# Patient Record
Sex: Male | Born: 1960 | Race: Asian | Hispanic: No | Marital: Married | Smoking: Never smoker
Health system: Southern US, Community
[De-identification: ages and names within clinical notes are randomized; demographics above are authoritative.]

## PROBLEM LIST (undated history)

## (undated) DIAGNOSIS — E785 Hyperlipidemia, unspecified: Secondary | ICD-10-CM

## (undated) DIAGNOSIS — I509 Heart failure, unspecified: Secondary | ICD-10-CM

## (undated) DIAGNOSIS — I10 Essential (primary) hypertension: Secondary | ICD-10-CM

## (undated) DIAGNOSIS — I480 Paroxysmal atrial fibrillation: Secondary | ICD-10-CM

## (undated) DIAGNOSIS — I2510 Atherosclerotic heart disease of native coronary artery without angina pectoris: Secondary | ICD-10-CM

## (undated) DIAGNOSIS — I255 Ischemic cardiomyopathy: Secondary | ICD-10-CM

## (undated) DIAGNOSIS — I442 Atrioventricular block, complete: Secondary | ICD-10-CM

## (undated) HISTORY — DX: Essential (primary) hypertension: I10

## (undated) HISTORY — DX: Atherosclerotic heart disease of native coronary artery without angina pectoris: I25.10

## (undated) HISTORY — DX: Paroxysmal atrial fibrillation: I48.0

## (undated) HISTORY — DX: Heart failure, unspecified: I50.9

## (undated) HISTORY — DX: Hyperlipidemia, unspecified: E78.5

## (undated) HISTORY — DX: Atrioventricular block, complete: I44.2

## (undated) HISTORY — DX: Ischemic cardiomyopathy: I25.5

---

## 2019-04-21 ENCOUNTER — Emergency Department: Payer: No Typology Code available for payment source | Admitting: Anesthesiology

## 2019-04-21 ENCOUNTER — Encounter: Payer: Self-pay | Admitting: Emergency Medicine

## 2019-04-21 ENCOUNTER — Inpatient Hospital Stay
Admission: EM | Admit: 2019-04-21 | Discharge: 2019-04-27 | DRG: 328 | Disposition: A | Discharge status: Home / Self Care | Payer: No Typology Code available for payment source | Attending: Surgery | Admitting: Surgery

## 2019-04-21 ENCOUNTER — Encounter
Admission: EM | Disposition: A | Discharge status: Home / Self Care | Payer: Self-pay | Source: Home / Self Care | Attending: Surgery

## 2019-04-21 ENCOUNTER — Emergency Department: Payer: No Typology Code available for payment source

## 2019-04-21 ENCOUNTER — Other Ambulatory Visit: Payer: Self-pay

## 2019-04-21 DIAGNOSIS — K668 Other specified disorders of peritoneum: Secondary | ICD-10-CM | POA: Diagnosis present

## 2019-04-21 DIAGNOSIS — Z23 Encounter for immunization: Secondary | ICD-10-CM

## 2019-04-21 DIAGNOSIS — I440 Atrioventricular block, first degree: Secondary | ICD-10-CM | POA: Diagnosis present

## 2019-04-21 DIAGNOSIS — K255 Chronic or unspecified gastric ulcer with perforation: Principal | ICD-10-CM | POA: Diagnosis present

## 2019-04-21 DIAGNOSIS — Z20828 Contact with and (suspected) exposure to other viral communicable diseases: Secondary | ICD-10-CM | POA: Diagnosis present

## 2019-04-21 HISTORY — PX: LAPAROTOMY: SHX154

## 2019-04-21 LAB — CBC
HCT: 48.2 % (ref 39.0–52.0)
Hemoglobin: 15.6 g/dL (ref 13.0–17.0)
MCH: 31.8 pg (ref 26.0–34.0)
MCHC: 32.4 g/dL (ref 30.0–36.0)
MCV: 98.2 fL (ref 80.0–100.0)
Platelets: 272 10*3/uL (ref 150–400)
RBC: 4.91 MIL/uL (ref 4.22–5.81)
RDW: 11.3 % — ABNORMAL LOW (ref 11.5–15.5)
WBC: 12.5 10*3/uL — ABNORMAL HIGH (ref 4.0–10.5)
nRBC: 0 % (ref 0.0–0.2)

## 2019-04-21 LAB — COMPREHENSIVE METABOLIC PANEL
ALT: 35 U/L (ref 0–44)
AST: 30 U/L (ref 15–41)
Albumin: 4.6 g/dL (ref 3.5–5.0)
Alkaline Phosphatase: 56 U/L (ref 38–126)
Anion gap: 12 (ref 5–15)
BUN: 19 mg/dL (ref 6–20)
CO2: 23 mmol/L (ref 22–32)
Calcium: 9.6 mg/dL (ref 8.9–10.3)
Chloride: 100 mmol/L (ref 98–111)
Creatinine, Ser: 0.79 mg/dL (ref 0.61–1.24)
GFR calc Af Amer: >60 mL/min (ref >60)
GFR calc non Af Amer: >60 mL/min (ref >60)
Glucose, Bld: 113 mg/dL — ABNORMAL HIGH (ref 70–99)
Potassium: 3.6 mmol/L (ref 3.5–5.1)
Sodium: 135 mmol/L (ref 135–145)
Total Bilirubin: 1.1 mg/dL (ref 0.3–1.2)
Total Protein: 8.5 g/dL — ABNORMAL HIGH (ref 6.5–8.1)

## 2019-04-21 LAB — LIPASE, BLOOD: Lipase: 30 U/L (ref 11–51)

## 2019-04-21 LAB — SARS CORONAVIRUS 2 BY RT PCR (HOSPITAL ORDER, PERFORMED IN ~~LOC~~ HOSPITAL LAB): SARS Coronavirus 2: NEGATIVE

## 2019-04-21 LAB — TROPONIN I (HIGH SENSITIVITY): Troponin I (High Sensitivity): 4 ng/L (ref <18)

## 2019-04-21 LAB — LACTIC ACID, PLASMA: Lactic Acid, Venous: 1.6 mmol/L (ref 0.5–1.9)

## 2019-04-21 SURGERY — LAPAROTOMY, EXPLORATORY
Anesthesia: General | Site: Abdomen

## 2019-04-21 MED ORDER — ONDANSETRON HCL 4 MG/2ML IJ SOLN
4.0000 mg | Freq: Once | INTRAMUSCULAR | Status: AC | PRN
  Administered 2019-04-21: 20:00:00 4 mg via INTRAVENOUS

## 2019-04-21 MED ORDER — ACETAMINOPHEN 10 MG/ML IV SOLN
INTRAVENOUS | Status: AC
  Filled 2019-04-21: qty 100

## 2019-04-21 MED ORDER — LACTATED RINGERS IV SOLN
INTRAVENOUS | Status: DC | PRN
  Administered 2019-04-21: 22:00:00 via INTRAVENOUS

## 2019-04-21 MED ORDER — SUGAMMADEX SODIUM 500 MG/5ML IV SOLN
INTRAVENOUS | Status: DC | PRN
  Administered 2019-04-21: 100 mg via INTRAVENOUS

## 2019-04-21 MED ORDER — PROPOFOL 10 MG/ML IV BOLUS
INTRAVENOUS | Status: DC | PRN
  Administered 2019-04-21: 100 mg via INTRAVENOUS

## 2019-04-21 MED ORDER — FENTANYL CITRATE (PF) 100 MCG/2ML IJ SOLN
INTRAMUSCULAR | Status: AC
  Filled 2019-04-21: qty 2

## 2019-04-21 MED ORDER — EPINEPHRINE PF 1 MG/ML IJ SOLN
INTRAMUSCULAR | Status: AC
  Filled 2019-04-21: qty 1

## 2019-04-21 MED ORDER — BUPIVACAINE-EPINEPHRINE 0.5% -1:200000 IJ SOLN
INTRAMUSCULAR | Status: DC | PRN
  Administered 2019-04-21: 30 mL

## 2019-04-21 MED ORDER — PROPOFOL 10 MG/ML IV BOLUS
INTRAVENOUS | Status: AC
  Filled 2019-04-21: qty 20

## 2019-04-21 MED ORDER — BUPIVACAINE LIPOSOME 1.3 % IJ SUSP
INTRAMUSCULAR | Status: DC | PRN
  Administered 2019-04-21: 20 mL

## 2019-04-21 MED ORDER — ONDANSETRON 4 MG PO TBDP: 4.0000 mg | ORAL_TABLET | Freq: Once | ORAL | Status: DC | PRN

## 2019-04-21 MED ORDER — BUPIVACAINE LIPOSOME 1.3 % IJ SUSP
INTRAMUSCULAR | Status: AC
  Filled 2019-04-21: qty 20

## 2019-04-21 MED ORDER — LIDOCAINE HCL (CARDIAC) PF 100 MG/5ML IV SOSY
PREFILLED_SYRINGE | INTRAVENOUS | Status: DC | PRN
  Administered 2019-04-21: 70 mg via INTRAVENOUS

## 2019-04-21 MED ORDER — SUCCINYLCHOLINE CHLORIDE 20 MG/ML IJ SOLN
INTRAMUSCULAR | Status: DC | PRN
  Administered 2019-04-21: 60 mg via INTRAVENOUS

## 2019-04-21 MED ORDER — FENTANYL CITRATE (PF) 100 MCG/2ML IJ SOLN
50.0000 ug | INTRAMUSCULAR | Status: AC | PRN
  Administered 2019-04-21 (×2): 50 ug via INTRAVENOUS
  Filled 2019-04-21: qty 2

## 2019-04-21 MED ORDER — PIPERACILLIN-TAZOBACTAM 3.375 G IVPB 30 MIN: 3.3750 g | Freq: Four times a day (QID) | INTRAVENOUS | Status: DC

## 2019-04-21 MED ORDER — PIPERACILLIN-TAZOBACTAM 3.375 G IVPB 30 MIN
3.3750 g | Freq: Once | INTRAVENOUS | Status: AC
  Administered 2019-04-21: 21:00:00 3.375 g via INTRAVENOUS
  Filled 2019-04-21: qty 50

## 2019-04-21 MED ORDER — ROCURONIUM BROMIDE 100 MG/10ML IV SOLN
INTRAVENOUS | Status: DC | PRN
  Administered 2019-04-21: 30 mg via INTRAVENOUS

## 2019-04-21 MED ORDER — ACETAMINOPHEN 10 MG/ML IV SOLN
INTRAVENOUS | Status: DC | PRN
  Administered 2019-04-21: 1000 mg via INTRAVENOUS

## 2019-04-21 MED ORDER — PROMETHAZINE HCL 25 MG/ML IJ SOLN: 6.2500 mg | INTRAMUSCULAR | Status: DC | PRN

## 2019-04-21 MED ORDER — ONDANSETRON HCL 4 MG/2ML IJ SOLN
INTRAMUSCULAR | Status: DC | PRN
  Administered 2019-04-21: 4 mg via INTRAVENOUS

## 2019-04-21 MED ORDER — ONDANSETRON HCL 4 MG/2ML IJ SOLN
INTRAMUSCULAR | Status: AC
  Administered 2019-04-21: 4 mg via INTRAVENOUS
  Filled 2019-04-21: qty 2

## 2019-04-21 MED ORDER — SODIUM CHLORIDE (PF) 0.9 % IJ SOLN
INTRAMUSCULAR | Status: AC
  Filled 2019-04-21: qty 50

## 2019-04-21 MED ORDER — PIPERACILLIN-TAZOBACTAM 3.375 G IVPB
3.3750 g | Freq: Three times a day (TID) | INTRAVENOUS | Status: DC
  Administered 2019-04-22 – 2019-04-27 (×16): 3.375 g via INTRAVENOUS
  Filled 2019-04-21 (×16): qty 50

## 2019-04-21 MED ORDER — SODIUM CHLORIDE (PF) 0.9 % IJ SOLN
INTRAMUSCULAR | Status: DC | PRN
  Administered 2019-04-21: 50 mL via INTRAVENOUS

## 2019-04-21 MED ORDER — MORPHINE SULFATE (PF) 4 MG/ML IV SOLN
4.0000 mg | Freq: Once | INTRAVENOUS | Status: AC
  Administered 2019-04-21: 21:00:00 4 mg via INTRAVENOUS
  Filled 2019-04-21: qty 1

## 2019-04-21 MED ORDER — FENTANYL CITRATE (PF) 100 MCG/2ML IJ SOLN
INTRAMUSCULAR | Status: AC
  Administered 2019-04-21: 20:00:00 50 ug via INTRAVENOUS
  Filled 2019-04-21: qty 2

## 2019-04-21 MED ORDER — FENTANYL CITRATE (PF) 100 MCG/2ML IJ SOLN
INTRAMUSCULAR | Status: DC | PRN
  Administered 2019-04-21 (×2): 50 ug via INTRAVENOUS

## 2019-04-21 MED ORDER — FENTANYL CITRATE (PF) 100 MCG/2ML IJ SOLN: 25.0000 ug | INTRAMUSCULAR | Status: DC | PRN

## 2019-04-21 MED ORDER — BUPIVACAINE HCL (PF) 0.5 % IJ SOLN
INTRAMUSCULAR | Status: AC
  Filled 2019-04-21: qty 30

## 2019-04-21 SURGICAL SUPPLY — 42 items
BULB RESERV EVAC DRAIN JP 100C (MISCELLANEOUS) ×3 IMPLANT
CANISTER SUCT 1200ML W/VALVE (MISCELLANEOUS) ×3 IMPLANT
CHLORAPREP W/TINT 26 (MISCELLANEOUS) ×3 IMPLANT
COVER WAND RF STERILE (DRAPES) IMPLANT
DRAIN CHANNEL JP 15F RND 16 (MISCELLANEOUS) ×3 IMPLANT
DRAPE LAPAROTOMY 100X77 ABD (DRAPES) ×3 IMPLANT
DRSG OPSITE POSTOP 4X10 (GAUZE/BANDAGES/DRESSINGS) IMPLANT
DRSG OPSITE POSTOP 4X6 (GAUZE/BANDAGES/DRESSINGS) ×3 IMPLANT
DRSG OPSITE POSTOP 4X8 (GAUZE/BANDAGES/DRESSINGS) IMPLANT
DRSG TEGADERM 4X10 (GAUZE/BANDAGES/DRESSINGS) IMPLANT
DRSG TELFA 3X8 NADH (GAUZE/BANDAGES/DRESSINGS) IMPLANT
ELECT REM PT RETURN 9FT ADLT (ELECTROSURGICAL) ×3
ELECTRODE REM PT RTRN 9FT ADLT (ELECTROSURGICAL) ×1 IMPLANT
GLOVE BIOGEL PI IND STRL 7.0 (GLOVE) ×3 IMPLANT
GLOVE BIOGEL PI INDICATOR 7.0 (GLOVE) ×6
GLOVE SURG SYN 6.5 ES PF (GLOVE) ×9 IMPLANT
GOWN STRL REUS W/ TWL LRG LVL3 (GOWN DISPOSABLE) ×3 IMPLANT
GOWN STRL REUS W/TWL LRG LVL3 (GOWN DISPOSABLE) ×6
KIT TURNOVER KIT A (KITS) ×3 IMPLANT
LABEL OR SOLS (LABEL) IMPLANT
NEEDLE FILTER BLUNT 18X 1/2SAF (NEEDLE) ×2
NEEDLE FILTER BLUNT 18X1 1/2 (NEEDLE) ×1 IMPLANT
NEEDLE HYPO 22GX1.5 SAFETY (NEEDLE) ×3 IMPLANT
NS IRRIG 1000ML POUR BTL (IV SOLUTION) ×3 IMPLANT
PACK BASIN MAJOR ARMC (MISCELLANEOUS) ×3 IMPLANT
PACK COLON CLEAN CLOSURE (MISCELLANEOUS) IMPLANT
SLEEVE SCD COMPRESS THIGH MED (MISCELLANEOUS) ×3 IMPLANT
SPONGE DRAIN TRACH 4X4 STRL 2S (GAUZE/BANDAGES/DRESSINGS) ×3 IMPLANT
SPONGE LAP 18X18 RF (DISPOSABLE) ×3 IMPLANT
STAPLER SKIN PROX 35W (STAPLE) ×3 IMPLANT
SUT ETHILON 3-0 FS-10 30 BLK (SUTURE) ×3
SUT PDS AB 1 TP1 54 (SUTURE) ×6 IMPLANT
SUT SILK 2 0 (SUTURE) ×2
SUT SILK 2-0 18XBRD TIE 12 (SUTURE) ×1 IMPLANT
SUT SILK 3 0 (SUTURE) ×2
SUT SILK 3-0 18XBRD TIE 12 (SUTURE) ×1 IMPLANT
SUT VIC AB 3-0 SH 27 (SUTURE) ×4
SUT VIC AB 3-0 SH 27X BRD (SUTURE) ×2 IMPLANT
SUTURE EHLN 3-0 FS-10 30 BLK (SUTURE) ×1 IMPLANT
SYR 20ML LL LF (SYRINGE) ×6 IMPLANT
SYR TB 1ML 27GX1/2 LL (SYRINGE) ×3 IMPLANT
TRAY FOLEY MTR SLVR 16FR STAT (SET/KITS/TRAYS/PACK) ×3 IMPLANT

## 2019-04-21 NOTE — Anesthesia Preprocedure Evaluation (Signed)
Anesthesia Evaluation  Patient identified by MRN, date of birth, ID band Patient awake    Reviewed: Allergy & Precautions, H&P , NPO status , Patient's Chart, lab work & pertinent test results, reviewed documented beta blocker date and time   History of Anesthesia Complications Negative for: history of anesthetic complications  Airway Mallampati: I  TM Distance: >3 FB Neck ROM: full    Dental  (+) Partial Upper, Partial Lower, Missing, Dental Advidsory Given   Pulmonary shortness of breath (Currently, secondary to pain), neg recent URI,    Pulmonary exam normal        Cardiovascular Exercise Tolerance: Good (-) hypertension+ angina (Currently, but negative EKG and trop) (-) Past MI Normal cardiovascular exam(-) dysrhythmias (-) Valvular Problems/Murmurs     Neuro/Psych negative neurological ROS  negative psych ROS   GI/Hepatic Neg liver ROS, PUD (suspected perfed ulcer),   Endo/Other  negative endocrine ROS  Renal/GU negative Renal ROS  negative genitourinary   Musculoskeletal   Abdominal   Peds  Hematology negative hematology ROS (+)   Anesthesia Other Findings History reviewed. No pertinent past medical history.   Reproductive/Obstetrics negative OB ROS                             Anesthesia Physical Anesthesia Plan  ASA: II and emergent  Anesthesia Plan: General   Post-op Pain Management:    Induction: Intravenous, Rapid sequence and Cricoid pressure planned  PONV Risk Score and Plan: 2 and Ondansetron, Dexamethasone, Midazolam, Promethazine and Treatment may vary due to age or medical condition  Airway Management Planned: Oral ETT  Additional Equipment:   Intra-op Plan:   Post-operative Plan: Extubation in OR  Informed Consent: I have reviewed the patients History and Physical, chart, labs and discussed the procedure including the risks, benefits and alternatives for the  proposed anesthesia with the patient or authorized representative who has indicated his/her understanding and acceptance.     Dental Advisory Given  Plan Discussed with: Anesthesiologist, CRNA and Surgeon  Anesthesia Plan Comments:         Anesthesia Quick Evaluation

## 2019-04-21 NOTE — ED Notes (Signed)
Archie Balboa MD updated on pt status. Orders obtained.

## 2019-04-21 NOTE — ED Triage Notes (Signed)
Patient to ER for c/o epigastric pain that began suddenly approx 45 mins ago. Reports feeling like he had an ulcer last week. States he collapsed at work, did not have syncopal episode.

## 2019-04-21 NOTE — Transfer of Care (Signed)
Immediate Anesthesia Transfer of Care Note  Patient: Craig Reid  Procedure(s) Performed: EXPLORATORY LAPAROTOMY, gastric ulcer repair (N/A Abdomen)  Patient Location: PACU  Anesthesia Type:General  Level of Consciousness: sedated  Airway & Oxygen Therapy: Patient Spontanous Breathing and Patient connected to face mask oxygen  Post-op Assessment: Report given to RN and Post -op Vital signs reviewed and stable  Post vital signs: Reviewed and stable  Last Vitals:  Vitals Value Taken Time  BP 157/88 04/21/19 2352  Temp    Pulse 87 04/21/19 2353  Resp 18 04/21/19 2353  SpO2 100 % 04/21/19 2353  Vitals shown include unvalidated device data.  Last Pain:  Vitals:   04/21/19 2009  TempSrc: Oral  PainSc: 10-Worst pain ever         Complications: No apparent anesthesia complications

## 2019-04-21 NOTE — ED Notes (Signed)
Barbee Cough (co-worker) [phone] Zigmund Daniel (niece) [phone]

## 2019-04-21 NOTE — H&P (Signed)
Subjective:   CC: pneumoperitoneum  HPI:  Craig Reid is a 58 y.o. male who was consulted by Derrill Kay for issue above.  Symptoms were first noted a few hours ago. Pain is sudden, severe located in epigastric pain.  Associated with nothing specific, exacerbated by nothing specfic.     Past Medical History: none reported  Past Surgical History: none reported  Family History: not related to current issue  Social History:  reports that he has never smoked. He has never used smokeless tobacco. He reports that he does not drink alcohol. No history on file for drug.  Current Medications: MVI  Allergies:  Allergies as of 04/21/2019  . (No Known Allergies)    ROS:  General: Denies weight loss, weight gain, fatigue, fevers, chills, and night sweats. Eyes: Denies blurry vision, double vision, eye pain, itchy eyes, and tearing. Ears: Denies hearing loss, earache, and ringing in ears. Nose: Denies sinus pain, congestion, infections, runny nose, and nosebleeds. Mouth/throat: Denies hoarseness, sore throat, bleeding gums, and difficulty swallowing. Heart: Denies chest pain, palpitations, racing heart, irregular heartbeat, leg pain or swelling, and decreased activity tolerance. Respiratory: Denies breathing difficulty, shortness of breath, wheezing, cough, and sputum. GI: Denies change in appetite, heartburn, nausea, vomiting, constipation, diarrhea, and blood in stool. GU: Denies difficulty urinating, pain with urinating, urgency, frequency, blood in urine. Musculoskeletal: Denies joint stiffness, pain, swelling, muscle weakness. Skin: Denies rash, itching, mass, tumors, sores, and boils Neurologic: Denies headache, fainting, dizziness, seizures, numbness, and tingling. Psychiatric: Denies depression, anxiety, difficulty sleeping, and memory loss. Endocrine: Denies heat or cold intolerance, and increased thirst or urination. Blood/lymph: Denies easy bruising, easy bruising, and swollen  glands     Objective:     BP (!) 142/92   Pulse 74   Resp 20   Ht 5\' 5"  (1.651 m)   Wt 49.9 kg   SpO2 100%   BMI 18.30 kg/m   Constitutional :  alert, cooperative, appears stated age and no distress  Lymphatics/Throat:  no asymmetry, masses, or scars  Respiratory:  clear to auscultation bilaterally  Cardiovascular:  regular rate and rhythm  Gastrointestinal: guarding, and TTP all quadrants.   Musculoskeletal: Steady movement  Skin: Cool and moist   Psychiatric: Normal affect, non-agitated, not confused       LABS:  CMP Latest Ref Rng & Units 04/21/2019  Glucose 70 - 99 mg/dL 04/23/2019)  BUN 6 - 20 mg/dL 19  Creatinine 272(Z - 3.66 mg/dL 4.40  Sodium 3.47 - 425 mmol/L 135  Potassium 3.5 - 5.1 mmol/L 3.6  Chloride 98 - 111 mmol/L 100  CO2 22 - 32 mmol/L 23  Calcium 8.9 - 10.3 mg/dL 9.6  Total Protein 6.5 - 8.1 g/dL 956)  Total Bilirubin 0.3 - 1.2 mg/dL 1.1  Alkaline Phos 38 - 126 U/L 56  AST 15 - 41 U/L 30  ALT 0 - 44 U/L 35   CBC Latest Ref Rng & Units 04/21/2019  WBC 4.0 - 10.5 K/uL 12.5(H)  Hemoglobin 13.0 - 17.0 g/dL 04/23/2019  Hematocrit 56.4 - 52.0 % 48.2  Platelets 150 - 400 K/uL 272    RADS: CLINICAL DATA:  Epigastric pain, shortness of breath  EXAM: CHEST - 2 VIEW  COMPARISON:  None.  FINDINGS: There is free intraperitoneal air noted under both hemidiaphragms. Heart and mediastinal contours are within normal limits. No focal opacities or effusions. No acute bony abnormality.  IMPRESSION: No active cardiopulmonary disease.  Pneumoperitoneum concerning for perforated bowel.  Critical Value/emergent results were  called by telephone at the time of interpretation on 04/21/2019 at 8:27 pm to Graham , who verbally acknowledged these results.   Electronically Signed   By: Rolm Baptise M.D.   On: 04/21/2019 20:28 Assessment:   Pneumoperitoneum, likely perforated gastric ulcer based on epigastric pain  Plan:      The  risk of surgery include, but not limited to, recurrence, bleeding, chronic pain, post-op infxn, post-op SBO or ileus, hernias, resection of bowel, re-anastamosis, and need for re-operation to address said risks. The risks of general anesthetic, if used, includes MI, CVA, sudden death or even reaction to anesthetic medications also discussed. Alternatives include none. Benefits include possible symptom relief, preventing further decline in health and possible death.  Typical post-op recovery time of additional days in hospital for observation afterwards also discussed.  The patient verbalized understanding and all questions were answered to the patient's satisfaction.  Encounter done via nice over phone who is in healthcare field due to lack of available translator.  To OR for ex-lap, pending covid but if not ready by the time room is setup, will still proceed due to emergent nature of issue.

## 2019-04-21 NOTE — Anesthesia Procedure Notes (Signed)
Procedure Name: Intubation Date/Time: 04/21/2019 10:27 PM Performed by: Lendon Colonel, CRNA Pre-anesthesia Checklist: Patient identified, Patient being monitored, Timeout performed, Emergency Drugs available and Suction available Patient Re-evaluated:Patient Re-evaluated prior to induction Oxygen Delivery Method: Circle system utilized Preoxygenation: Pre-oxygenation with 100% oxygen Induction Type: IV induction, Rapid sequence and Cricoid Pressure applied Laryngoscope Size: 3 and McGraph Grade View: Grade I Tube type: Oral Tube size: 7.5 mm Number of attempts: 1 Airway Equipment and Method: Stylet Placement Confirmation: ETT inserted through vocal cords under direct vision,  positive ETCO2 and breath sounds checked- equal and bilateral Secured at: 21 cm Tube secured with: Tape Dental Injury: Teeth and Oropharynx as per pre-operative assessment

## 2019-04-21 NOTE — Anesthesia Post-op Follow-up Note (Signed)
Anesthesia QCDR form completed.        

## 2019-04-21 NOTE — ED Provider Notes (Signed)
[facility] Emergency Department Provider Note   ____________________________________________   I have reviewed the triage vital signs and the nursing notes.   HISTORY  Chief Complaint Abdominal Pain   History limited by: Language barrier.    HPI Craig Reid is a 58 y.o. male who presents to the emergency department today because of concerns for abdominal pain.  Patient speaks very minimal [place].  Translation was done by patient's coworker.  Apparently roughly 45 minutes prior to arrival the patient had sudden onset of acute abdominal pain.  Located in the center abdomen.  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  Patient apparently has a history of gastric ulcers.  Patient states that he is otherwise been feeling okay recently.  No previous medical record in our EMR.   History reviewed. No pertinent past medical history.  There are no active problems to display for this patient.   History reviewed. No pertinent surgical history.  Prior to Admission medications   Medication Sig Start Date End Date Taking? Authorizing Provider  Multiple Vitamin (MULTIVITAMIN WITH MINERALS) TABS tablet Take 1 tablet by mouth daily.   Yes [provider]    Allergies Patient has no known allergies.  No family history on file.  Social History Social History   Tobacco Use  . Smoking status: Never Smoker  . Smokeless tobacco: Never Used  Substance Use Topics  . Alcohol use: Never    Frequency: Never  . Drug use: Not on file    Review of Systems Constitutional: No fever/chills Eyes: No visual changes. ENT: No sore throat. Cardiovascular: Denies chest pain. Respiratory: Denies shortness of breath. Gastrointestinal: Positive for abdominal pain.  Genitourinary: Negative for dysuria. Musculoskeletal: Negative for back pain. Skin: Negative for rash. Neurological: Negative for headaches, focal  weakness or numbness.  ____________________________________________   PHYSICAL EXAM:  VITAL SIGNS: ED Triage Vitals  Enc Vitals Group     BP 04/21/19 2009 (!) 179/97     Pulse Rate 04/21/19 2009 90     Resp 04/21/19 2009 20     Temp --      Temp Source 04/21/19 2009 Oral     SpO2 04/21/19 2009 100 %     Weight 04/21/19 2010 110 lb (49.9 kg)     Height 04/21/19 2010 5\' 5"  (1.651 m)     Head Circumference --      Peak Flow --      Pain Score 04/21/19 2009 10   Constitutional: Alert and oriented. Appears uncomfortable.  Eyes: Conjunctivae are normal.  ENT      Head: Normocephalic and atraumatic.      Nose: No congestion/rhinnorhea.      Mouth/Throat: Mucous membranes are moist.      Neck: No stridor. Hematological/Lymphatic/Immunilogical: No cervical lymphadenopathy. Cardiovascular: Normal rate, regular rhythm.  No murmurs, rubs, or gallops. Respiratory: Normal respiratory effort without tachypnea nor retractions. Breath sounds are clear and equal bilaterally. No wheezes/rales/rhonchi. Gastrointestinal: Tense. Tender to palpation.  Genitourinary: Deferred Musculoskeletal: Normal range of motion in all extremities. No lower extremity edema. Neurologic:  Normal speech and language. No gross focal neurologic deficits are appreciated.  Skin:  Skin is warm, dry and intact. No rash noted. Psychiatric: Mood and affect are normal. Speech and behavior are normal. Patient exhibits appropriate insight and judgment.  ____________________________________________    LABS (pertinent positives/negatives)  Trop hs 4 Lipase 30 CBC wbc 12.5, hgb 15.6, plt  272 CMP wnl except glu 113, t pro 8.5 Lactic 1.6 COVID neg  ____________________________________________   EKG  I, Nance Pear, attending physician, personally viewed and interpreted this EKG  EKG Time: 2010 Rate: 83 Rhythm: sinus rhythm with 1st degree av block Axis: left axis deviation Intervals: qtc 427 QRS: narrow, q  waves v1, v2 ST changes: no st elevation Impression: abnormal ekg   ____________________________________________    RADIOLOGY  CXR Pneumoperitoneum.   ____________________________________________   PROCEDURES  Procedures  CRITICAL CARE Performed by: Nance Pear   Total critical care time: 30 minutes  Critical care time was exclusive of separately billable procedures and treating other patients.  Critical care was necessary to treat or prevent imminent or life-threatening deterioration.  Critical care was time spent personally by me on the following activities: development of treatment plan with patient and/or surrogate as well as nursing, discussions with consultants, evaluation of patient's response to treatment, examination of patient, obtaining history from patient or surrogate, ordering and performing treatments and interventions, ordering and review of laboratory studies, ordering and review of radiographic studies, pulse oximetry and re-evaluation of patient's condition.  ____________________________________________   INITIAL IMPRESSION / ASSESSMENT AND PLAN / ED COURSE  Pertinent labs & imaging results that were available during my care of the patient were reviewed by me and considered in my medical decision making (see chart for details).   Patient presented to the emergency department today because of concerns for sudden onset of abdominal pain.  The patient did have a concerning abdominal exam.  It was a tense abdomen with tenderness.  Chest x-ray ordered from triage does show pneumoperitoneum.  Given history of gastric ulcers to have concerns for perforated ulcer.  Did discuss with surgery.  They will take patient to the OR for exploratory laparotomy.  Did give patient dose of antibiotics here in the emergency department.  Discussed findings with patient.  Also discussed over the telephone with niece.  ____________________________________________   FINAL  CLINICAL IMPRESSION(S) / ED DIAGNOSES  Final diagnoses:  Pneumoperitoneum     Note: This dictation was prepared with Dragon dictation. Any transcriptional errors that result from this process are unintentional     Nance Pear, MD 04/21/19 2218

## 2019-04-22 ENCOUNTER — Other Ambulatory Visit: Payer: Self-pay

## 2019-04-22 ENCOUNTER — Encounter: Payer: Self-pay | Admitting: Surgery

## 2019-04-22 LAB — BASIC METABOLIC PANEL
Anion gap: 14 (ref 5–15)
BUN: 14 mg/dL (ref 6–20)
CO2: 27 mmol/L (ref 22–32)
Calcium: 8.9 mg/dL (ref 8.9–10.3)
Chloride: 96 mmol/L — ABNORMAL LOW (ref 98–111)
Creatinine, Ser: 0.92 mg/dL (ref 0.61–1.24)
GFR calc Af Amer: >60 mL/min (ref >60)
GFR calc non Af Amer: >60 mL/min (ref >60)
Glucose, Bld: 114 mg/dL — ABNORMAL HIGH (ref 70–99)
Potassium: 4 mmol/L (ref 3.5–5.1)
Sodium: 137 mmol/L (ref 135–145)

## 2019-04-22 LAB — CBC
HCT: 42.2 % (ref 39.0–52.0)
Hemoglobin: 13.7 g/dL (ref 13.0–17.0)
MCH: 31.4 pg (ref 26.0–34.0)
MCHC: 32.5 g/dL (ref 30.0–36.0)
MCV: 96.8 fL (ref 80.0–100.0)
Platelets: 230 10*3/uL (ref 150–400)
RBC: 4.36 MIL/uL (ref 4.22–5.81)
RDW: 11.5 % (ref 11.5–15.5)
WBC: 18 10*3/uL — ABNORMAL HIGH (ref 4.0–10.5)
nRBC: 0 % (ref 0.0–0.2)

## 2019-04-22 MED ORDER — KETOROLAC TROMETHAMINE 30 MG/ML IJ SOLN
30.0000 mg | Freq: Four times a day (QID) | INTRAMUSCULAR | Status: AC | PRN
  Administered 2019-04-22 – 2019-04-25 (×5): 30 mg via INTRAVENOUS
  Filled 2019-04-22 (×6): qty 1

## 2019-04-22 MED ORDER — ENOXAPARIN SODIUM 40 MG/0.4ML ~~LOC~~ SOLN
40.0000 mg | SUBCUTANEOUS | Status: DC
  Administered 2019-04-22 – 2019-04-26 (×5): 40 mg via SUBCUTANEOUS
  Filled 2019-04-22 (×5): qty 0.4

## 2019-04-22 MED ORDER — MORPHINE SULFATE (PF) 2 MG/ML IV SOLN
1.0000 mg | INTRAVENOUS | Status: DC | PRN
  Administered 2019-04-23 – 2019-04-24 (×2): 1 mg via INTRAVENOUS
  Filled 2019-04-22 (×2): qty 1

## 2019-04-22 MED ORDER — INFLUENZA VAC SPLIT QUAD 0.5 ML IM SUSY
0.5000 mL | PREFILLED_SYRINGE | INTRAMUSCULAR | Status: AC
  Administered 2019-04-25: 0.5 mL via INTRAMUSCULAR
  Filled 2019-04-22: qty 0.5

## 2019-04-22 MED ORDER — PANTOPRAZOLE SODIUM 40 MG IV SOLR
40.0000 mg | Freq: Every day | INTRAVENOUS | Status: DC
  Administered 2019-04-22 – 2019-04-26 (×6): 40 mg via INTRAVENOUS
  Filled 2019-04-22 (×6): qty 40

## 2019-04-22 MED ORDER — PHENOL 1.4 % MT LIQD
1.0000 | OROMUCOSAL | Status: DC | PRN
  Filled 2019-04-22: qty 177

## 2019-04-22 MED ORDER — ONDANSETRON HCL 4 MG/2ML IJ SOLN
4.0000 mg | Freq: Four times a day (QID) | INTRAMUSCULAR | Status: DC | PRN
  Administered 2019-04-25: 06:00:00 4 mg via INTRAVENOUS
  Filled 2019-04-22: qty 2

## 2019-04-22 MED ORDER — SODIUM CHLORIDE 0.9 % IV SOLN
INTRAVENOUS | Status: DC | PRN
  Administered 2019-04-22 – 2019-04-25 (×2): 250 mL via INTRAVENOUS

## 2019-04-22 MED ORDER — LACTATED RINGERS IV SOLN
INTRAVENOUS | Status: DC
  Administered 2019-04-22 – 2019-04-23 (×3): via INTRAVENOUS

## 2019-04-22 MED ORDER — ONDANSETRON 4 MG PO TBDP: 4.0000 mg | ORAL_TABLET | Freq: Four times a day (QID) | ORAL | Status: DC | PRN

## 2019-04-22 NOTE — Progress Notes (Signed)
Pharmacy Antibiotic Note  Craig Reid is a 58 y.o. male admitted on 04/21/2019 with pneumoperitoneum s/t perforated bowel.  Pharmacy has been consulted for zosyn dosing.  Plan: Zosyn 3.375g IV q8h (4 hour infusion).  Height: 5\' 5"  (165.1 cm) Weight: 110 lb (49.9 kg) IBW/kg (Calculated) : 61.5  Temp (24hrs), Avg:97.5 F (36.4 C), Min:97 F (36.1 C), Max:98 F (36.7 C)  Recent Labs  Lab 04/21/19 2015 04/21/19 2016  WBC  --  12.5*  CREATININE  --  0.79  LATICACIDVEN 1.6  --     Estimated Creatinine Clearance: 72.8 mL/min (by C-G formula based on SCr of 0.79 mg/dL).    No Known Allergies  Thank you for allowing pharmacy to be a part of this patient's care.  Tobie Lords, PharmD, BCPS Clinical Pharmacist 04/22/2019 1:03 AM

## 2019-04-22 NOTE — Progress Notes (Signed)
Subjective:  CC: Craig Reid is a 58 y.o. male  Hospital stay day 1, 1 Day Post-Op open gastric ulcer perforation repair and Phillip Heal patch  HPI: Reporting of heart rate dropping into the low 40s with "irregularities", asymptomatic throughout episode.  No issues at time of exam.   Objective:   Temp:  [97 F (36.1 C)-98.2 F (36.8 C)] 97.7 F (36.5 C) (11/18 0508) Pulse Rate:  [47-99] 47 (11/18 0623) Resp:  [16-37] 16 (11/18 0623) BP: (112-179)/(66-97) 112/66 (11/18 0508) SpO2:  [96 %-100 %] 98 % (11/18 0623) Weight:  [49.9 kg] 49.9 kg (11/17 2010)     Height: 5\' 5"  (165.1 cm) Weight: 49.9 kg BMI (Calculated): 18.31   Intake/Output this shift:   Intake/Output Summary (Last 24 hours) at 04/22/2019 1017 Last data filed at 04/22/2019 0547 Gross per 24 hour  Intake 1144.78 ml  Output 1080 ml  Net 64.78 ml    Constitutional :  alert, cooperative, appears stated age and no distress  Respiratory:  clear to auscultation bilaterally  Cardiovascular:  regular rate and rhythm  Gastrointestinal: soft, no guarding, midline incisision c/d/i with staples.  JP with miinimal serosanguinous drainage.   Skin: Cool and moist.   Psychiatric: Normal affect, non-agitated, not confused       LABS:  CMP Latest Ref Rng & Units 04/22/2019 04/21/2019  Glucose 70 - 99 mg/dL 114(H) 113(H)  BUN 6 - 20 mg/dL 14 19  Creatinine 0.61 - 1.24 mg/dL 0.92 0.79  Sodium 135 - 145 mmol/L 137 135  Potassium 3.5 - 5.1 mmol/L 4.0 3.6  Chloride 98 - 111 mmol/L 96(L) 100  CO2 22 - 32 mmol/L 27 23  Calcium 8.9 - 10.3 mg/dL 8.9 9.6  Total Protein 6.5 - 8.1 g/dL - 8.5(H)  Total Bilirubin 0.3 - 1.2 mg/dL - 1.1  Alkaline Phos 38 - 126 U/L - 56  AST 15 - 41 U/L - 30  ALT 0 - 44 U/L - 35   CBC Latest Ref Rng & Units 04/22/2019 04/21/2019  WBC 4.0 - 10.5 K/uL 18.0(H) 12.5(H)  Hemoglobin 13.0 - 17.0 g/dL 13.7 15.6  Hematocrit 39.0 - 52.0 % 42.2 48.2  Platelets 150 - 400 K/uL 230 272     RADS: n/a Assessment:   S/p  open gastric ulcer perforation repair and Phillip Heal patch.  Doing well.  Continue NG decompression and NPO status for couple more days.  IVF, abx in the meantime.

## 2019-04-22 NOTE — Op Note (Signed)
Preoperative diagnosis: Perforated viscus  Postoperative diagnosis: Perforated gastric ulcer Procedure: Exploratory laparotomy, repair of perforated gastric ulcer and Phillip Heal patch  Anesthesia: GETA  Surgeon: Lysle Pearl  Wound Classification: Clean Contaminated  Indications:  Patient is a 57 y.o. male that presented with pneumoperitoneum requiring emergent surgery.  Please see H&P for further details  Description of procedure: The patient was taken to the operating room and placed in the supine position. General endotracheal anesthesia was induced without any difficulty. A time-out was completed verifying correct patient, procedure, site, positioning, and implant(s) and/or special equipment prior to beginning this procedure. Both upper extremities were tucked.  A Foley catheter and an orogastric tube were placed. Preoperative antibiotics were given.   Subxiphoid midline incision made and dissection carried down to fascia, which was incised sharply and peritoneal cavity entered.  Minimal purulent fluid or gastric contents noted on initial examination.  A roughly 3 mm hole was noted in the anterior portion of the body of the stomach, with minimal exudative material around it.  Further inspection of the visible abdominal cavity noted no other obvious pathology or pockets of purulent fluid.  The gastric perforation was then primary closed with 3-0 silk x1.  The repair was then reinforced with a Phillip Heal patch by placing 2 additional 3-0 silk sutures around the repair, and then pulling a part of the omentum over it and then securing it down over the repair with the previously placed silk sutures.  Areas was inspected one more time and no additional pathology was noted.  JP drain placed overlying the repair and coming out from the right upper quadrant.  Drain was secured to the skin using 3-0 nylon. Midline incision was then closed using #1 PDS x2.  Overlying skin was then closed with staples.  Incision then  dressed with honeycomb dressing.   The patient tolerated the procedure well and was extubated and taken to the postoperative care unit in stable condition accompanied by the surgical and anesthesia team.  Sponge and instrument count correct at end of procedure.

## 2019-04-22 NOTE — Anesthesia Postprocedure Evaluation (Signed)
Anesthesia Post Note  Patient: Craig Reid  Procedure(s) Performed: EXPLORATORY LAPAROTOMY, gastric ulcer repair (N/A Abdomen)  Patient location during evaluation: PACU Anesthesia Type: General Level of consciousness: awake and alert Pain management: pain level controlled Vital Signs Assessment: post-procedure vital signs reviewed and stable Respiratory status: spontaneous breathing, nonlabored ventilation, respiratory function stable and patient connected to nasal cannula oxygen Cardiovascular status: blood pressure returned to baseline and stable Postop Assessment: no apparent nausea or vomiting Anesthetic complications: no     Last Vitals:  Vitals:   04/22/19 0050 04/22/19 0103  BP:  131/78  Pulse: 95 97  Resp: (!) 21 18  Temp:  36.8 C  SpO2: 96% 97%    Last Pain:  Vitals:   04/22/19 0103  TempSrc: Oral  PainSc:                  Martha Clan

## 2019-04-23 LAB — BASIC METABOLIC PANEL
Anion gap: 10 (ref 5–15)
BUN: 17 mg/dL (ref 6–20)
CO2: 25 mmol/L (ref 22–32)
Calcium: 8.8 mg/dL — ABNORMAL LOW (ref 8.9–10.3)
Chloride: 104 mmol/L (ref 98–111)
Creatinine, Ser: 1.17 mg/dL (ref 0.61–1.24)
GFR calc Af Amer: >60 mL/min (ref >60)
GFR calc non Af Amer: >60 mL/min (ref >60)
Glucose, Bld: 74 mg/dL (ref 70–99)
Potassium: 3.7 mmol/L (ref 3.5–5.1)
Sodium: 139 mmol/L (ref 135–145)

## 2019-04-23 LAB — CBC
HCT: 40.7 % (ref 39.0–52.0)
Hemoglobin: 13.6 g/dL (ref 13.0–17.0)
MCH: 31.9 pg (ref 26.0–34.0)
MCHC: 33.4 g/dL (ref 30.0–36.0)
MCV: 95.3 fL (ref 80.0–100.0)
Platelets: 198 10*3/uL (ref 150–400)
RBC: 4.27 MIL/uL (ref 4.22–5.81)
RDW: 11.7 % (ref 11.5–15.5)
WBC: 9.9 10*3/uL (ref 4.0–10.5)
nRBC: 0 % (ref 0.0–0.2)

## 2019-04-23 MED ORDER — KCL IN DEXTROSE-NACL 40-5-0.45 MEQ/L-%-% IV SOLN
INTRAVENOUS | Status: DC
  Administered 2019-04-23 – 2019-04-24 (×3): via INTRAVENOUS
  Filled 2019-04-23 (×5): qty 1000

## 2019-04-23 NOTE — Progress Notes (Signed)
Subjective:  CC: Craig Reid is a 58 y.o. male  Hospital stay day 2, 2 Days Post-Op open gastric ulcer perforation repair and Graham patch  HPI: No acute issues overnight.  Objective:   Temp:  [97.6 F (36.4 C)-98.4 F (36.9 C)] 97.9 F (36.6 C) (11/19 1155) Pulse Rate:  [73-95] 73 (11/19 1155) Resp:  [20-28] 20 (11/19 1155) BP: (114-131)/(78-89) 131/84 (11/19 1155) SpO2:  [96 %-98 %] 98 % (11/19 1155)     Height: 5\' 5"  (165.1 cm) Weight: 49.9 kg BMI (Calculated): 18.31   Intake/Output this shift:   Intake/Output Summary (Last 24 hours) at 04/23/2019 1352 Last data filed at 04/23/2019 8413 Gross per 24 hour  Intake 1957.41 ml  Output 1905 ml  Net 52.41 ml   NG with thin gastric output Constitutional :  alert, cooperative, appears stated age and no distress  Respiratory:  clear to auscultation bilaterally  Cardiovascular:  regular rate and rhythm  Gastrointestinal: soft, no guarding, midline incisision c/d/i with staples.  JP with miinimal serosanguinous drainage.   Skin: Cool and moist.   Psychiatric: Normal affect, non-agitated, not confused       LABS:  CMP Latest Ref Rng & Units 04/23/2019 04/22/2019 04/21/2019  Glucose 70 - 99 mg/dL 74 114(H) 113(H)  BUN 6 - 20 mg/dL 17 14 19   Creatinine 0.61 - 1.24 mg/dL 1.17 0.92 0.79  Sodium 135 - 145 mmol/L 139 137 135  Potassium 3.5 - 5.1 mmol/L 3.7 4.0 3.6  Chloride 98 - 111 mmol/L 104 96(L) 100  CO2 22 - 32 mmol/L 25 27 23   Calcium 8.9 - 10.3 mg/dL 8.8(L) 8.9 9.6  Total Protein 6.5 - 8.1 g/dL - - 8.5(H)  Total Bilirubin 0.3 - 1.2 mg/dL - - 1.1  Alkaline Phos 38 - 126 U/L - - 56  AST 15 - 41 U/L - - 30  ALT 0 - 44 U/L - - 35   CBC Latest Ref Rng & Units 04/23/2019 04/22/2019 04/21/2019  WBC 4.0 - 10.5 K/uL 9.9 18.0(H) 12.5(H)  Hemoglobin 13.0 - 17.0 g/dL 13.6 13.7 15.6  Hematocrit 39.0 - 52.0 % 40.7 42.2 48.2  Platelets 150 - 400 K/uL 198 230 272    RADS: n/a Assessment:   S/p  open gastric ulcer  perforation repair and Phillip Heal patch.  Doing well.  Continue NG decompression and NPO status for tonight.  Upper GI study tomorrow am.  Cont abx for perforation

## 2019-04-24 ENCOUNTER — Inpatient Hospital Stay: Payer: No Typology Code available for payment source

## 2019-04-24 MED ORDER — IOHEXOL 300 MG/ML  SOLN
100.0000 mL | Freq: Once | INTRAMUSCULAR | Status: AC | PRN
  Administered 2019-04-24: 09:00:00 100 mL

## 2019-04-24 NOTE — Progress Notes (Signed)
RN remove NG tube per MD order. RN checked residual after clamping NG tube for 3 hours and residuals were only 50 ml. RN will continue to assess and monitor pt.

## 2019-04-24 NOTE — Progress Notes (Signed)
Pharmacy Antibiotic Note  Craig Reid is a 58 y.o. male admitted on 04/21/2019 with pneumoperitoneum s/t perforated bowel.  Pharmacy has been consulted for zosyn dosing. This is hospital stay day 3, 3 Days Post-Op open gastric ulcer perforation repair and Phillip Heal patch. Imaging on 11/20 shows no evidence of leak following patch repair.   Plan: Continue Zosyn 3.375g IV q8h (4 hour infusion).  Height: 5\' 5"  (165.1 cm) Weight: 110 lb (49.9 kg) IBW/kg (Calculated) : 61.5  Temp (24hrs), Avg:97.8 F (36.6 C), Min:97.8 F (36.6 C), Max:97.9 F (36.6 C)  Recent Labs  Lab 04/21/19 2015 04/21/19 2016 04/22/19 0643 04/23/19 0541  WBC  --  12.5* 18.0* 9.9  CREATININE  --  0.79 0.92 1.17  LATICACIDVEN 1.6  --   --   --     Estimated Creatinine Clearance: 49.8 mL/min (by C-G formula based on SCr of 1.17 mg/dL).    No Known Allergies  Thank you for allowing pharmacy to be a part of this patient's care.  Vallery Sa, PharmD Clinical Pharmacist 04/24/2019 10:53 AM

## 2019-04-25 NOTE — Progress Notes (Signed)
POD #  4 graham patch Gi series w/o leak ngt out Taking clears but feeling full  PE NAD Abd: dressing intact, no peritonitis no iinfection, decrease BS and mildly distended  A/ p expected ileus, he feels full and does not want to try any full liquids Mobilize continue IVF and rechek labs I am

## 2019-04-26 LAB — CBC
HCT: 41.6 % (ref 39.0–52.0)
Hemoglobin: 13.6 g/dL (ref 13.0–17.0)
MCH: 31.6 pg (ref 26.0–34.0)
MCHC: 32.7 g/dL (ref 30.0–36.0)
MCV: 96.5 fL (ref 80.0–100.0)
Platelets: 228 10*3/uL (ref 150–400)
RBC: 4.31 MIL/uL (ref 4.22–5.81)
RDW: 11.1 % — ABNORMAL LOW (ref 11.5–15.5)
WBC: 4.8 10*3/uL (ref 4.0–10.5)
nRBC: 0 % (ref 0.0–0.2)

## 2019-04-26 LAB — BASIC METABOLIC PANEL
Anion gap: 10 (ref 5–15)
BUN: 17 mg/dL (ref 6–20)
CO2: 26 mmol/L (ref 22–32)
Calcium: 9.3 mg/dL (ref 8.9–10.3)
Chloride: 102 mmol/L (ref 98–111)
Creatinine, Ser: 1.08 mg/dL (ref 0.61–1.24)
GFR calc Af Amer: >60 mL/min (ref >60)
GFR calc non Af Amer: >60 mL/min (ref >60)
Glucose, Bld: 90 mg/dL (ref 70–99)
Potassium: 3.8 mmol/L (ref 3.5–5.1)
Sodium: 138 mmol/L (ref 135–145)

## 2019-04-26 NOTE — Progress Notes (Signed)
POD #  5 graham patch Gi series w/o leak Used interpreter on a stick He wishes to try fulls, he feels well, no N/V Had BM  PE NAD Abd: incision c/d/i,  no peritonitis no iinfection, decrease BS a Jp serous  A/ p  Full liquids and soft diet for supper If he does well may dc in am

## 2019-04-27 MED ORDER — IBUPROFEN 800 MG PO TABS: 800.0000 mg | ORAL_TABLET | Freq: Three times a day (TID) | ORAL | 0 refills | Status: DC | PRN

## 2019-04-27 MED ORDER — DOCUSATE SODIUM 100 MG PO CAPS: 100.0000 mg | ORAL_CAPSULE | Freq: Two times a day (BID) | ORAL | 0 refills | Status: AC | PRN

## 2019-04-27 MED ORDER — ACETAMINOPHEN 325 MG PO TABS: 650.0000 mg | ORAL_TABLET | Freq: Three times a day (TID) | ORAL | 0 refills | Status: AC | PRN

## 2019-04-27 MED ORDER — PANTOPRAZOLE SODIUM 40 MG PO TBEC: 40.0000 mg | DELAYED_RELEASE_TABLET | Freq: Every day | ORAL | 1 refills | Status: DC

## 2019-04-27 MED ORDER — HYDROCODONE-ACETAMINOPHEN 5-325 MG PO TABS: 1.0000 | ORAL_TABLET | Freq: Four times a day (QID) | ORAL | 0 refills | Status: DC | PRN

## 2019-04-27 NOTE — Discharge Summary (Signed)
Physician Discharge Summary  Patient ID: Craig Reid MRN: [id number] DOB/AGE: [birth year] 58 y.o.  Admit date: 04/21/2019 Discharge date: 04/27/2019  Admission Diagnoses: pnuemoperitoneum  Discharge Diagnoses:  Gastric perforation  Discharged Condition: good  Hospital Course: admitted for above. Underwent repair.  Subsequent upper GI study negative for leak.  Advanced diet as tolerated.  At time of d/c, tolerating diet, pain controlled.  JP removed prior to d/c  Consults: None  Discharge Exam: Blood pressure 124/82, pulse 76, temperature 97.6 F (36.4 C), temperature source Oral, resp. rate 16, height 5\' 5"  (1.651 m), weight 49.9 kg, SpO2 99 %. General appearance: alert, cooperative and no distress GI: soft, tender around staple line with some bruising.  JP site c/d/i  Disposition:  Discharge disposition: 01-Home or Self Care       Discharge Instructions    Discharge patient   Complete by: As directed    Discharge disposition: 01-Home or Self Care   Discharge patient date: 04/27/2019     Allergies as of 04/27/2019   No Known Allergies     Medication List    TAKE these medications   acetaminophen 325 MG tablet Commonly known as: Tylenol Take 2 tablets (650 mg total) by mouth every 8 (eight) hours as needed for mild pain.   docusate sodium 100 MG capsule Commonly known as: Colace Take 1 capsule (100 mg total) by mouth 2 (two) times daily as needed for up to 10 days for mild constipation.   HYDROcodone-acetaminophen 5-325 MG tablet Commonly known as: Norco Take 1 tablet by mouth every 6 (six) hours as needed for up to 6 doses for moderate pain.   ibuprofen 800 MG tablet Commonly known as: ADVIL Take 1 tablet (800 mg total) by mouth every 8 (eight) hours as needed for mild pain or moderate pain.   multivitamin with minerals Tabs tablet Take 1 tablet by mouth daily.   pantoprazole 40 MG tablet Commonly known as: Protonix Take 1 tablet (40 mg total)  by mouth daily.      Follow-up Information    Benjamine Sprague, DO Follow up on 04/29/2019.   Specialty: Surgery Why: for wound check and staple removal Contact information: [address] [place] [address] [phone]            Total time spent arranging discharge was >27min. Signed: Benjamine Sprague 04/27/2019, 11:44 AM

## 2019-04-27 NOTE — Discharge Instructions (Signed)
Gastric perforation Care After This sheet gives you information about how to care for yourself after your procedure. Your health care provider may also give you more specific instructions. If you have problems or questions, contact your health care provider. What can I expect after the procedure? After your procedure, it is common to have the following:  Pain in your abdomen, especially in the incision areas. You will be given medicine to control the pain.  Tiredness. This is a normal part of the recovery process. Your energy level will return to normal over the next several weeks.  Changes in your bowel movements, such as constipation or needing to go more often. Talk with your health care provider about how to manage this. Follow these instructions at home: Medicines   tylenol and advil as needed for discomfort.  Please alternate between the two every four hours as needed for pain.     Use narcotics, if prescribed, only when tylenol and motrin is not enough to control pain.   325-650mg  every 8hrs to max of 4000mg /24hrs (including the 325mg  in every norco dose) for the tylenol.     Advil up to 800mg  per dose every 8hrs as needed for pain.    Do not drive or use heavy machinery while taking prescription pain medicine.  Do not drink alcohol while taking prescription pain medicine.  If you were prescribed an antibiotic medicine, use it as told by your health care provider. Do not stop using the antibiotic even if you start to feel better. Incision care     Follow instructions from your health care provider about how to take care of your incision areas. Make sure you: ? Keep your incisions clean and dry. ? Wash your hands with soap and water before and after applying medicine to the areas, and before and after changing your bandage (dressing). If soap and water are not available, use hand sanitizer. ? Change your dressing as told by your health care provider. ? Leave stitches  (sutures), skin glue, or adhesive strips in place. These skin closures may need to stay in place for 2 weeks or longer. If adhesive strip edges start to loosen and curl up, you may trim the loose edges. Do not remove adhesive strips completely unless your health care provider tells you to do that. ? KEEP JP DRESSING SITE COVERED FOR 48HRS, then ok to shower.  Change dressing over area daily until healed   Do not wear tight clothing over the incisions. Tight clothing may rub and irritate the incision areas, which may cause the incisions to open.  Do not take baths, swim, or use a hot tub until your health care provider approves. OK TO SHOWER.    Check your incision area every day for signs of infection. Check for: ? More redness, swelling, or pain. ? More fluid or blood. ? Warmth. ? Pus or a bad smell. Activity  Avoid lifting anything that is heavier than 10 lb (4.5 kg) for 2 weeks or until your health care provider says it is okay.  You may resume normal activities as told by your health care provider. Ask your health care provider what activities are safe for you.  Take rest breaks during the day as needed. Eating and drinking  Follow instructions from your health care provider about what you can eat after surgery.  To prevent or treat constipation while you are taking prescription pain medicine, your health care provider may recommend that you: ? Drink enough fluid to  keep your urine clear or pale yellow. ? Take over-the-counter or prescription medicines. ? Eat foods that are high in fiber, such as fresh fruits and vegetables, whole grains, and beans. ? Limit foods that are high in fat and processed sugars, such as fried and sweet foods. General instructions  Ask your health care provider when you will need an appointment to get your sutures or staples removed.  Keep all follow-up visits as told by your health care provider. This is important. Contact a health care provider  if:  You have more redness, swelling, or pain around your incisions.  You have more fluid or blood coming from the incisions.  Your incisions feel warm to the touch.  You have pus or a bad smell coming from your incisions or your dressing.  You have a fever.  You have an incision that breaks open (edges not staying together) after sutures or staples have been removed. Get help right away if:  You develop a rash.  You have chest pain or difficulty breathing.  You have pain or swelling in your legs.  You feel light-headed or you faint.  Your abdomen swells (becomes distended).  You have nausea or vomiting.  You have blood in your stool (feces). This information is not intended to replace advice given to you by your health care provider. Make sure you discuss any questions you have with your health care provider. Document Released: 12/08/2004 Document Revised: 02/07/2018 Document Reviewed: 02/20/2016 Elsevier Interactive Patient Education  2019 [employer].

## 2019-04-27 NOTE — Progress Notes (Signed)
Craig Reid to be D/C'd home per MD order.  Discussed prescriptions and follow up appointments with the patient. Prescriptions given to patient, medication list explained in detail. Pt verbalized understanding.  Allergies as of 04/27/2019   No Known Allergies     Medication List    TAKE these medications   acetaminophen 325 MG tablet Commonly known as: Tylenol Take 2 tablets (650 mg total) by mouth every 8 (eight) hours as needed for mild pain.   docusate sodium 100 MG capsule Commonly known as: Colace Take 1 capsule (100 mg total) by mouth 2 (two) times daily as needed for up to 10 days for mild constipation.   HYDROcodone-acetaminophen 5-325 MG tablet Commonly known as: Norco Take 1 tablet by mouth every 6 (six) hours as needed for up to 6 doses for moderate pain.   ibuprofen 800 MG tablet Commonly known as: ADVIL Take 1 tablet (800 mg total) by mouth every 8 (eight) hours as needed for mild pain or moderate pain.   multivitamin with minerals Tabs tablet Take 1 tablet by mouth daily.   pantoprazole 40 MG tablet Commonly known as: Protonix Take 1 tablet (40 mg total) by mouth daily.       Vitals:   04/27/19 0608 04/27/19 1151  BP: 124/82 (!) 137/93  Pulse: 76 72  Resp: 16 16  Temp: 97.6 F (36.4 C) 98.9 F (37.2 C)  SpO2: 99% 100%    Skin clean, dry and intact without evidence of skin break down, no evidence of skin tears noted. IV catheter discontinued intact. Site without signs and symptoms of complications. Dressing and pressure applied. Pt denies pain at this time. No complaints noted.  An After Visit Summary was printed and given to the patient. Patient escorted via [place], and D/C home via private auto.  Chuck Hint RN [facility] [employer]

## 2020-07-04 IMAGING — RF DG UGI W SINGLE CM
10 of 13 series · 14 of 19 positions shown · IV contrast (omnipaque)
Comparison: None.

FLUOROSCOPY TIME:  Fluoroscopy Time:  [DATE]

Number of Acquired Spot Images: 22

CLINICAL DATA: Status post patch repair gastric perforation, leak
check

EXAM:
WATER SOLUBLE UPPER GI SERIES
TECHNIQUE: Single-column upper GI series was performed using water soluble
contrast.
CONTRAST:  100 mL dilute Omnipaque water-soluble contrast

[Series 1: fluoro_barium 2fps_bw · 0.17mm/px · 1 of 2 frames shown (1 of 10)]
[frame 1/2]
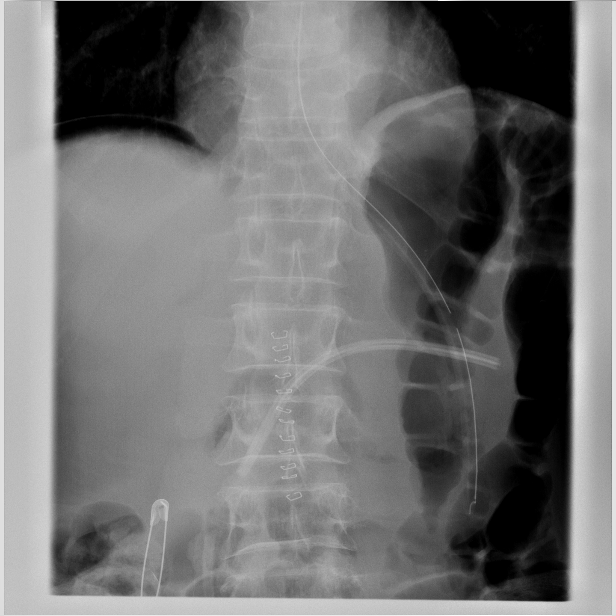

[Series 2: fluoro_barium 2fps_bw · 0.17mm/px · 2 of 2 frames shown (2 of 10)]
[frame 1/2]
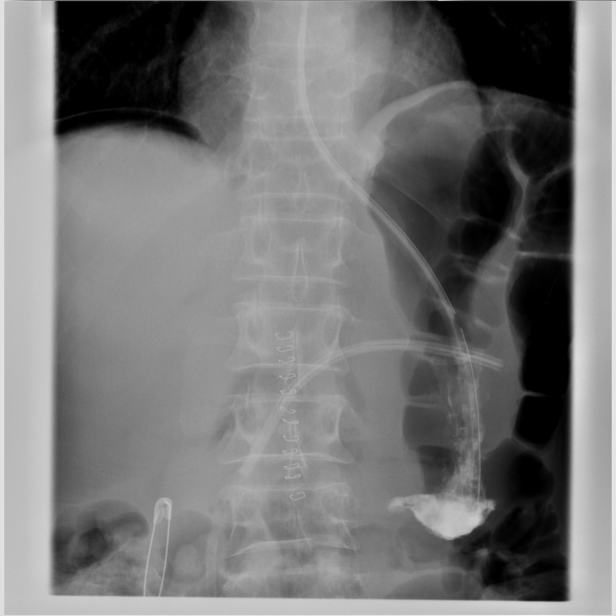
[frame 2/2]
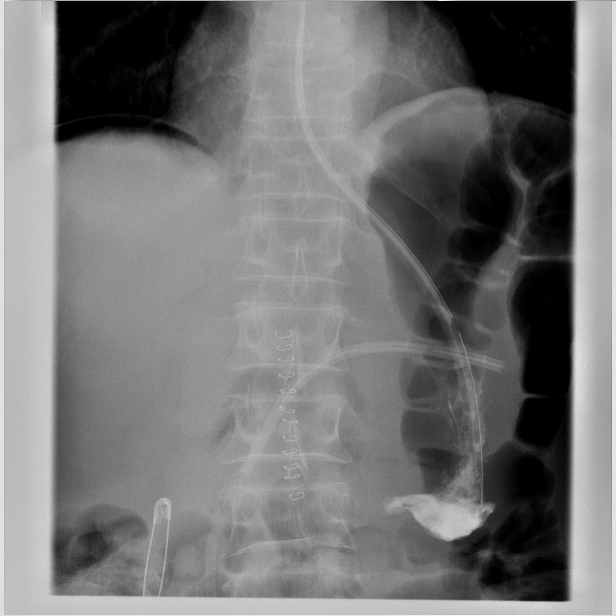

[Series 3: fluoro_barium 2fps_bw · 0.17mm/px · 1 of 1 slices shown (3 of 10)]
[im 1/1]
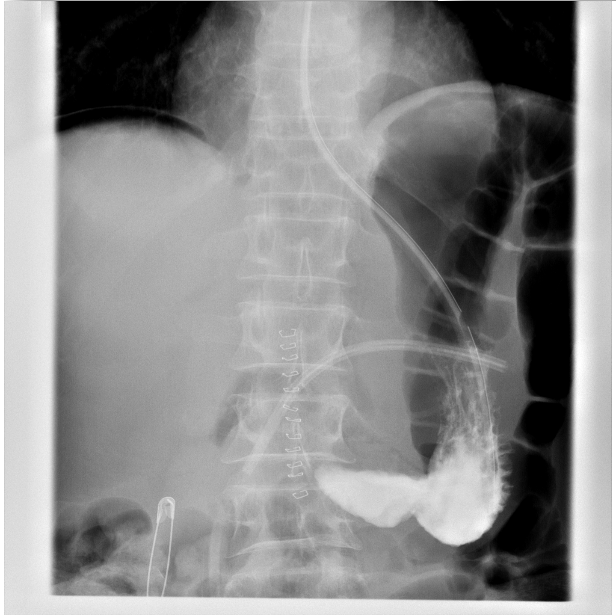

[Series 5: fluoro_barium 2fps_bw · 0.17mm/px · 1 of 1 slices shown (4 of 10)]
[im 1/1]
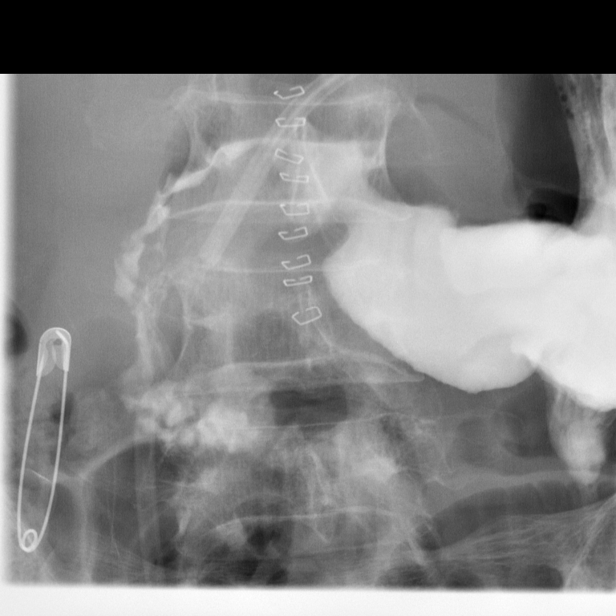

[Series 6: fluoro_barium 2fps_bw · 0.17mm/px · 2 of 2 frames shown (5 of 10)]
[frame 1/2]
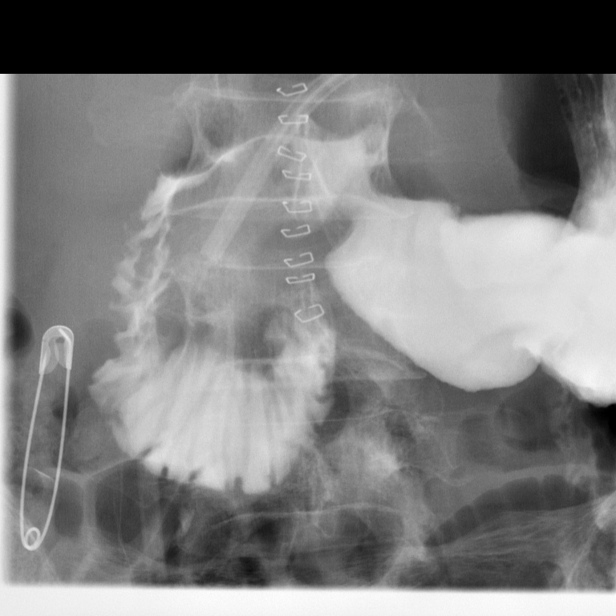
[frame 2/2]
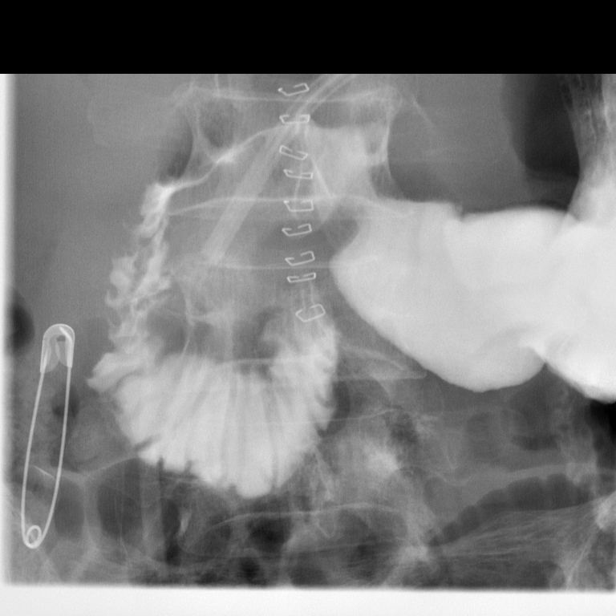

[Series 8: fluoro_barium 2fps_bw · 0.17mm/px · 2 of 2 frames shown (6 of 10)]
[frame 1/2]
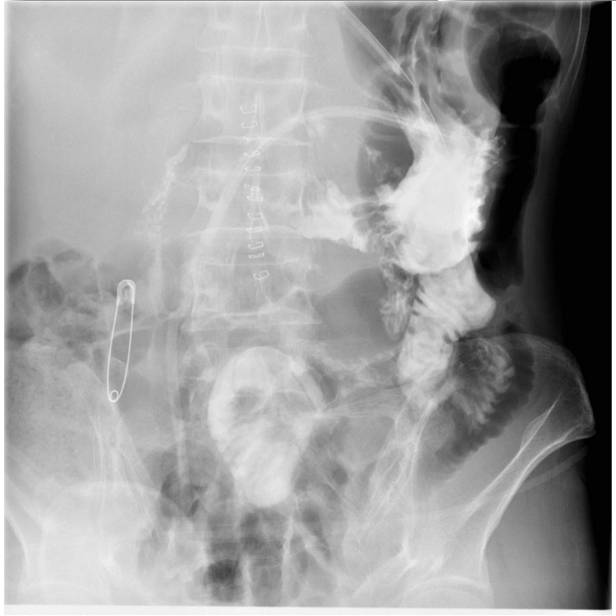
[frame 2/2]
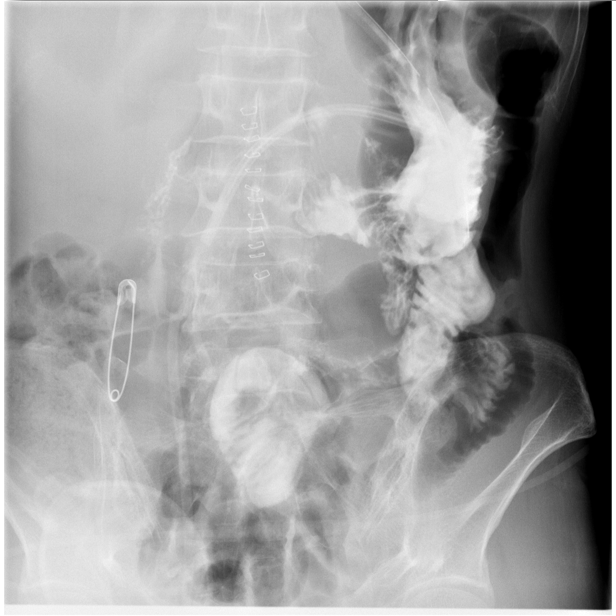

[Series 9: fluoro_barium 2fps_bw · 0.17mm/px · 1 of 1 slices shown (7 of 10)]
[im 1/1]
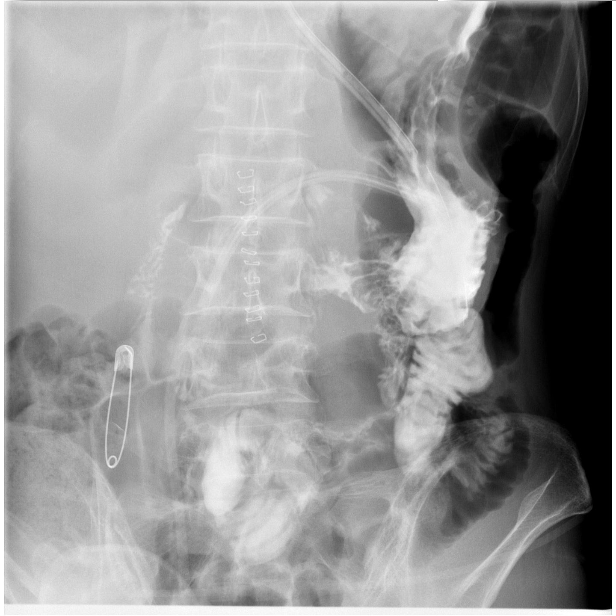

[Series 11: fluoro_barium 2fps_bw · 0.17mm/px · 2 of 2 frames shown (8 of 10)]
[frame 1/2]
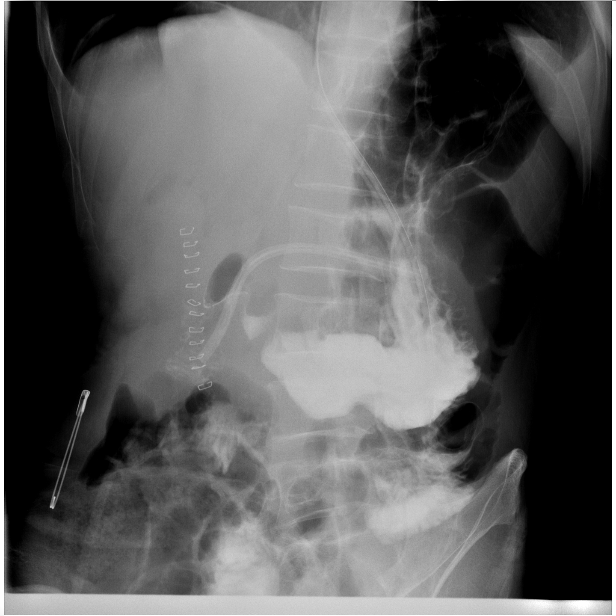
[frame 2/2]
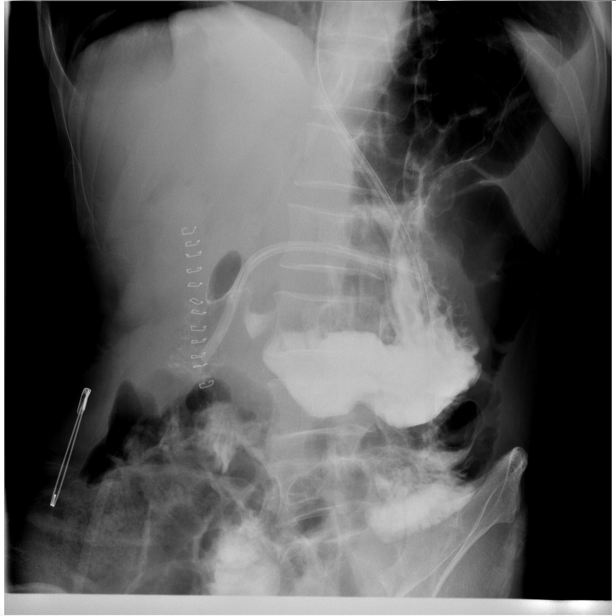

[Series 12: fluoro_barium 2fps_bw · 0.17mm/px · 1 of 2 frames shown (9 of 10)]
[frame 1/2]
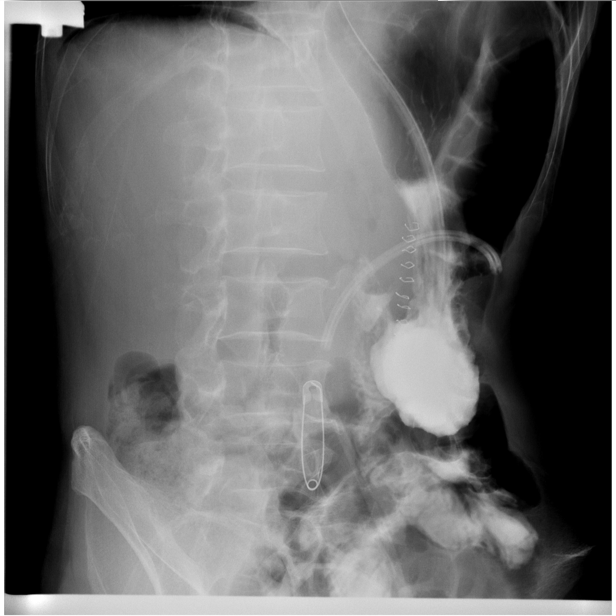

[Series 13: fluoro_barium 2fps_bw · 0.17mm/px · 1 of 1 slices shown (10 of 10)]
[im 1/1]
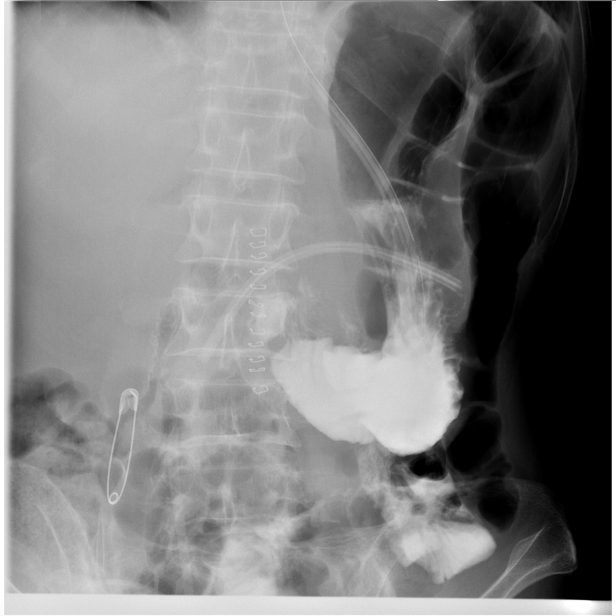

[14 of 19 positions shown; findings below may reference images not displayed]

FINDINGS: Following initial scout images, nasogastric tube was gently injected
with dilute Omnipaque water-soluble contrast under fluoroscopic
observation, until the gastric lumen was adequately opacified and
commencement of proximal small bowel transit. The patient was imaged
in supine and standing position with oblique views obtained. No
evidence of postoperative leak. Following the examination, remaining
contrast was gently aspirated through the nasogastric tube.
IMPRESSION: No evidence of postoperative leak following patch repair of gastric
perforation.

## 2022-08-05 ENCOUNTER — Encounter
Admission: EM | Disposition: A | Discharge status: Home / Self Care | Payer: Self-pay | Source: Home / Self Care | Attending: Student

## 2022-08-05 ENCOUNTER — Inpatient Hospital Stay
Admission: EM | Admit: 2022-08-05 | Discharge: 2022-08-10 | DRG: 321 | Disposition: A | Discharge status: Home / Self Care | Payer: 59 | Attending: Internal Medicine | Admitting: Internal Medicine

## 2022-08-05 DIAGNOSIS — I4439 Other atrioventricular block: Secondary | ICD-10-CM | POA: Diagnosis not present

## 2022-08-05 DIAGNOSIS — I429 Cardiomyopathy, unspecified: Secondary | ICD-10-CM | POA: Diagnosis not present

## 2022-08-05 DIAGNOSIS — I4891 Unspecified atrial fibrillation: Secondary | ICD-10-CM | POA: Diagnosis not present

## 2022-08-05 DIAGNOSIS — I420 Dilated cardiomyopathy: Secondary | ICD-10-CM | POA: Diagnosis not present

## 2022-08-05 DIAGNOSIS — E872 Acidosis, unspecified: Secondary | ICD-10-CM | POA: Diagnosis not present

## 2022-08-05 DIAGNOSIS — I249 Acute ischemic heart disease, unspecified: Secondary | ICD-10-CM

## 2022-08-05 DIAGNOSIS — E1165 Type 2 diabetes mellitus with hyperglycemia: Secondary | ICD-10-CM | POA: Diagnosis not present

## 2022-08-05 DIAGNOSIS — I509 Heart failure, unspecified: Secondary | ICD-10-CM

## 2022-08-05 DIAGNOSIS — R001 Bradycardia, unspecified: Secondary | ICD-10-CM | POA: Insufficient documentation

## 2022-08-05 DIAGNOSIS — R7401 Elevation of levels of liver transaminase levels: Secondary | ICD-10-CM | POA: Diagnosis present

## 2022-08-05 DIAGNOSIS — R7989 Other specified abnormal findings of blood chemistry: Secondary | ICD-10-CM | POA: Insufficient documentation

## 2022-08-05 DIAGNOSIS — I25118 Atherosclerotic heart disease of native coronary artery with other forms of angina pectoris: Secondary | ICD-10-CM | POA: Diagnosis present

## 2022-08-05 DIAGNOSIS — Z603 Acculturation difficulty: Secondary | ICD-10-CM | POA: Diagnosis present

## 2022-08-05 DIAGNOSIS — K259 Gastric ulcer, unspecified as acute or chronic, without hemorrhage or perforation: Secondary | ICD-10-CM | POA: Diagnosis present

## 2022-08-05 DIAGNOSIS — I441 Atrioventricular block, second degree: Secondary | ICD-10-CM | POA: Insufficient documentation

## 2022-08-05 DIAGNOSIS — I959 Hypotension, unspecified: Secondary | ICD-10-CM | POA: Diagnosis not present

## 2022-08-05 DIAGNOSIS — I4510 Unspecified right bundle-branch block: Secondary | ICD-10-CM | POA: Diagnosis not present

## 2022-08-05 DIAGNOSIS — R55 Syncope and collapse: Secondary | ICD-10-CM | POA: Diagnosis not present

## 2022-08-05 DIAGNOSIS — R079 Chest pain, unspecified: Secondary | ICD-10-CM | POA: Diagnosis not present

## 2022-08-05 DIAGNOSIS — Z1152 Encounter for screening for COVID-19: Secondary | ICD-10-CM | POA: Diagnosis not present

## 2022-08-05 DIAGNOSIS — I442 Atrioventricular block, complete: Secondary | ICD-10-CM | POA: Diagnosis not present

## 2022-08-05 DIAGNOSIS — I2109 ST elevation (STEMI) myocardial infarction involving other coronary artery of anterior wall: Principal | ICD-10-CM

## 2022-08-05 DIAGNOSIS — E785 Hyperlipidemia, unspecified: Secondary | ICD-10-CM | POA: Diagnosis present

## 2022-08-05 DIAGNOSIS — I5021 Acute systolic (congestive) heart failure: Secondary | ICD-10-CM | POA: Diagnosis not present

## 2022-08-05 DIAGNOSIS — I213 ST elevation (STEMI) myocardial infarction of unspecified site: Secondary | ICD-10-CM | POA: Diagnosis not present

## 2022-08-05 DIAGNOSIS — Z9861 Coronary angioplasty status: Secondary | ICD-10-CM | POA: Diagnosis not present

## 2022-08-05 DIAGNOSIS — R072 Precordial pain: Secondary | ICD-10-CM

## 2022-08-05 DIAGNOSIS — I2510 Atherosclerotic heart disease of native coronary artery without angina pectoris: Secondary | ICD-10-CM

## 2022-08-05 DIAGNOSIS — Z79899 Other long term (current) drug therapy: Secondary | ICD-10-CM | POA: Diagnosis not present

## 2022-08-05 DIAGNOSIS — I491 Atrial premature depolarization: Secondary | ICD-10-CM | POA: Diagnosis not present

## 2022-08-05 DIAGNOSIS — I2101 ST elevation (STEMI) myocardial infarction involving left main coronary artery: Secondary | ICD-10-CM | POA: Diagnosis not present

## 2022-08-05 HISTORY — PX: CORONARY/GRAFT ACUTE MI REVASCULARIZATION: CATH118305

## 2022-08-05 HISTORY — PX: LEFT HEART CATH AND CORONARY ANGIOGRAPHY: CATH118249

## 2022-08-05 LAB — COMPREHENSIVE METABOLIC PANEL
ALT: 68 U/L — ABNORMAL HIGH (ref 0–44)
AST: 108 U/L — ABNORMAL HIGH (ref 15–41)
Albumin: 3.6 g/dL (ref 3.5–5.0)
Alkaline Phosphatase: 69 U/L (ref 38–126)
Anion gap: 12 (ref 5–15)
BUN: 22 mg/dL — ABNORMAL HIGH (ref 6–20)
CO2: 19 mmol/L — ABNORMAL LOW (ref 22–32)
Calcium: 8.7 mg/dL — ABNORMAL LOW (ref 8.9–10.3)
Chloride: 107 mmol/L (ref 98–111)
Creatinine, Ser: 1.12 mg/dL (ref 0.61–1.24)
GFR, Estimated: >60 mL/min (ref >60)
Glucose, Bld: 169 mg/dL — ABNORMAL HIGH (ref 70–99)
Potassium: 3.7 mmol/L (ref 3.5–5.1)
Sodium: 138 mmol/L (ref 135–145)
Total Bilirubin: 0.8 mg/dL (ref 0.3–1.2)
Total Protein: 7.4 g/dL (ref 6.5–8.1)

## 2022-08-05 LAB — CBC WITH DIFFERENTIAL/PLATELET
Abs Immature Granulocytes: 0.04 10*3/uL (ref 0.00–0.07)
Basophils Absolute: 0 10*3/uL (ref 0.0–0.1)
Basophils Relative: 1 %
Eosinophils Absolute: 0.1 10*3/uL (ref 0.0–0.5)
Eosinophils Relative: 1 %
HCT: 43.8 % (ref 39.0–52.0)
Hemoglobin: 13.9 g/dL (ref 13.0–17.0)
Immature Granulocytes: 1 %
Lymphocytes Relative: 37 %
Lymphs Abs: 2.7 10*3/uL (ref 0.7–4.0)
MCH: 31.1 pg (ref 26.0–34.0)
MCHC: 31.7 g/dL (ref 30.0–36.0)
MCV: 98 fL (ref 80.0–100.0)
Monocytes Absolute: 0.5 10*3/uL (ref 0.1–1.0)
Monocytes Relative: 6 %
Neutro Abs: 4 10*3/uL (ref 1.7–7.7)
Neutrophils Relative %: 54 %
Platelets: 214 10*3/uL (ref 150–400)
RBC: 4.47 MIL/uL (ref 4.22–5.81)
RDW: 11.4 % — ABNORMAL LOW (ref 11.5–15.5)
WBC: 7.4 10*3/uL (ref 4.0–10.5)
nRBC: 0 % (ref 0.0–0.2)

## 2022-08-05 LAB — LIPID PANEL
Cholesterol: 155 mg/dL (ref 0–200)
HDL: 58 mg/dL (ref >40)
LDL Cholesterol: 81 mg/dL (ref 0–99)
Total CHOL/HDL Ratio: 2.7 RATIO
Triglycerides: 78 mg/dL (ref <150)
VLDL: 16 mg/dL (ref 0–40)

## 2022-08-05 LAB — TROPONIN I (HIGH SENSITIVITY): Troponin I (High Sensitivity): 15 ng/L (ref <18)

## 2022-08-05 LAB — PROTIME-INR
INR: 1.1 (ref 0.8–1.2)
Prothrombin Time: 13.6 seconds (ref 11.4–15.2)

## 2022-08-05 LAB — APTT: aPTT: 34 seconds (ref 24–36)

## 2022-08-05 SURGERY — CORONARY/GRAFT ACUTE MI REVASCULARIZATION
Anesthesia: Moderate Sedation

## 2022-08-05 MED ORDER — TICAGRELOR 90 MG PO TABS
ORAL_TABLET | ORAL | Status: DC | PRN
  Administered 2022-08-05: 180 mg via ORAL

## 2022-08-05 MED ORDER — ATROPINE SULFATE 1 MG/10ML IJ SOSY
PREFILLED_SYRINGE | INTRAMUSCULAR | Status: AC
  Filled 2022-08-05: qty 10

## 2022-08-05 MED ORDER — HEPARIN (PORCINE) IN NACL 1000-0.9 UT/500ML-% IV SOLN
INTRAVENOUS | Status: DC | PRN
  Administered 2022-08-05 (×2): 500 mL

## 2022-08-05 MED ORDER — ASPIRIN 81 MG PO CHEW
81.0000 mg | CHEWABLE_TABLET | Freq: Every day | ORAL | Status: DC
  Administered 2022-08-06 – 2022-08-10 (×5): 81 mg via ORAL
  Filled 2022-08-05 (×5): qty 1

## 2022-08-05 MED ORDER — TICAGRELOR 90 MG PO TABS
ORAL_TABLET | ORAL | Status: AC
  Filled 2022-08-05: qty 2

## 2022-08-05 MED ORDER — HEPARIN SODIUM (PORCINE) 1000 UNIT/ML IJ SOLN
INTRAMUSCULAR | Status: AC
  Filled 2022-08-05: qty 10

## 2022-08-05 MED ORDER — HEPARIN SODIUM (PORCINE) 1000 UNIT/ML IJ SOLN
INTRAMUSCULAR | Status: DC | PRN
  Administered 2022-08-05: 2000 [IU] via INTRAVENOUS
  Administered 2022-08-05: 5000 [IU] via INTRAVENOUS

## 2022-08-05 MED ORDER — SODIUM CHLORIDE 0.9 % IV SOLN: INTRAVENOUS | Status: DC

## 2022-08-05 MED ORDER — LIDOCAINE HCL (PF) 1 % IJ SOLN
INTRAMUSCULAR | Status: DC | PRN
  Administered 2022-08-05: 8 mL

## 2022-08-05 MED ORDER — IOHEXOL 300 MG/ML  SOLN
INTRAMUSCULAR | Status: DC | PRN
  Administered 2022-08-05: 140 mL

## 2022-08-05 MED ORDER — TICAGRELOR 90 MG PO TABS
90.0000 mg | ORAL_TABLET | Freq: Two times a day (BID) | ORAL | Status: DC
  Administered 2022-08-06 (×2): 90 mg via ORAL
  Filled 2022-08-05 (×2): qty 1

## 2022-08-05 SURGICAL SUPPLY — 20 items
BALLN TREK RX 2.75X12 (BALLOONS) ×1
BALLOON TREK RX 2.75X12 (BALLOONS) IMPLANT
CATH INFINITI 5FR MULTPACK ANG (CATHETERS) IMPLANT
CATH VISTA GUIDE 6FR XBLAD3.5 (CATHETERS) IMPLANT
DEVICE CLOSURE MYNXGRIP 6/7F (Vascular Products) IMPLANT
DRAPE BRACHIAL (DRAPES) IMPLANT
GLIDESHEATH SLEND SS 6F .021 (SHEATH) IMPLANT
GUIDEWIRE INQWIRE 1.5J.035X260 (WIRE) IMPLANT
INQWIRE 1.5J .035X260CM (WIRE) ×1
KIT ENCORE 26 ADVANTAGE (KITS) IMPLANT
KIT MICROPUNCTURE NIT STIFF (SHEATH) IMPLANT
PACK CARDIAC CATH (CUSTOM PROCEDURE TRAY) ×1 IMPLANT
PROTECTION STATION PRESSURIZED (MISCELLANEOUS) ×1
SET ATX-X65L (MISCELLANEOUS) IMPLANT
SHEATH AVANTI 6FR X 11CM (SHEATH) IMPLANT
STATION PROTECTION PRESSURIZED (MISCELLANEOUS) IMPLANT
STENT ONYX FRONTIER 3.5X12 (Permanent Stent) IMPLANT
TUBING CIL FLEX 10 FLL-RA (TUBING) IMPLANT
WIRE GUIDERIGHT .035X150 (WIRE) IMPLANT
WIRE RUNTHROUGH .014X180CM (WIRE) IMPLANT

## 2022-08-05 NOTE — Progress Notes (Signed)
   08/05/22 2100  Spiritual Encounters  Type of Visit Initial  Referral source Code page  Reason for visit Code  OnCall Visit Yes   Chaplain responded to Code Stemi. No family present at time of visit. Chaplain services available if needed.

## 2022-08-05 NOTE — ED Notes (Signed)
ER physician at bedside

## 2022-08-05 NOTE — Consult Note (Signed)
Cardiology Consultation   Patient ID: Lenn Srinivasan MRN: [id number]; DOB: [birth year]  Admit date: 08/05/2022 Date of Consult: 08/05/2022  PCP:  Gregor Hams, [place] Providers Cardiologist: new Fletcher Anon)   Patient Profile:   Rieley Jaecks is a 62 y.o. male with a hx of gastric ulcer surgical repair in 2020 who is being seen 08/05/2022 for the evaluation of anterior STEMI at the request of Dr. Joni Fears.  History of Present Illness:   Mr. Richman is a 62 year old gentleman with no prior cardiac history.  Unfortunately, he does not speak [place] (he speaks lobation) and we initially had some difficulty finding an interpreter.  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  While he was working, he started having substernal chest tightness with dizziness followed by a brief loss of consciousness.  Bystanders performed CPR while calling 911 but the patient regained consciousness quickly after about 1 minute.  He was diaphoretic and gray.  His EKG showed minor anterior ST elevation with reciprocal ST depression in the inferior leads and thus a code STEMI was called.  He was still having chest pain in the ED in addition to dizziness and feeling very cold.  He had evidence of bradycardia with intermittent high-grade AV block.  Given his presentation and EKG changes, I recommended proceeding with emergent cardiac catheterization and possible PCI.   No past medical history on file.  Past Surgical History:  Procedure Laterality Date   LAPAROTOMY N/A 04/21/2019   Procedure: EXPLORATORY LAPAROTOMY, gastric ulcer repair;  Surgeon: Benjamine Sprague, DO;  Location: [facility] ORS;  Service: General;  Laterality: N/A;     Home Medications:  Prior to Admission medications   Medication Sig Start Date End Date Taking? Authorizing Provider  HYDROcodone-acetaminophen (NORCO) 5-325 MG tablet Take 1 tablet by mouth every 6 (six) hours as needed for up to 6  doses for moderate pain. 04/27/19   Lysle Pearl, Isami, DO  ibuprofen (ADVIL) 800 MG tablet Take 1 tablet (800 mg total) by mouth every 8 (eight) hours as needed for mild pain or moderate pain. 04/27/19   Benjamine Sprague, DO  Multiple Vitamin (MULTIVITAMIN WITH MINERALS) TABS tablet Take 1 tablet by mouth daily.    [provider]  pantoprazole (PROTONIX) 40 MG tablet Take 1 tablet (40 mg total) by mouth daily. 04/27/19 04/26/20  Benjamine Sprague, DO    Inpatient Medications: Scheduled Meds:  [START ON 08/06/2022] aspirin  81 mg Oral Daily   [START ON 08/06/2022] ticagrelor  90 mg Oral BID   Continuous Infusions:  sodium chloride 20 mL/hr at 08/05/22 2204   PRN Meds:   Allergies:   No Known Allergies  Social History:   Social History   Socioeconomic History   Marital status: Married    Spouse name: Not on file   Number of children: Not on file   Years of education: Not on file   Highest education level: Not on file  Occupational History   Not on file  Tobacco Use   Smoking status: Never   Smokeless tobacco: Never  Vaping Use   Vaping Use: Never used  Substance and Sexual Activity   Alcohol use: Never   Drug use: Not on file   Sexual activity: Not on file  Other Topics Concern   Not on file  Social History Narrative   Not on file   Social Determinants of Health   Financial Resource Strain: Not on file  Food  Insecurity: Not on file  Transportation Needs: Not on file  Physical Activity: Not on file  Stress: Not on file  Social Connections: Not on file  Intimate Partner Violence: Not on file    Family History:   No family history on file.   ROS:  Please see the history of present illness.   All other ROS reviewed and negative.     Physical Exam/Data:   Vitals:   08/05/22 2256 08/05/22 2301 08/05/22 2306 08/05/22 2311  BP: 106/73 124/69 132/77 (!) 146/101  Pulse: (!) 57 (!) 58 (!) 52 70  Resp: (!) '28 19 17 '$ (!) 21  SpO2:  100% 100% 100%   No intake or output  data in the 24 hours ending 08/05/22 2355    04/21/2019    8:10 PM  Last 3 Weights  Weight (lbs) 110 lb  Weight (kg) 49.896 kg     There is no height or weight on file to calculate BMI.  General:   Small built overall.  in moderate distress due to pain. HEENT: normal Neck: no JVD Vascular: No carotid bruits; Distal pulses 2+ bilaterally Cardiac:  normal S1, S2; RRR; no murmur  Lungs:  clear to auscultation bilaterally, no wheezing, rhonchi or rales  Abd: soft, nontender, no hepatomegaly  Ext: no edema Musculoskeletal:  No deformities, BUE and BLE strength normal and equal Skin: warm and dry  Neuro:  CNs 2-12 intact, no focal abnormalities noted Psych:  Normal affect   EKG:  The EKG was personally reviewed and demonstrates: Sinus versus ectopic atrial rhythm with Wenckebach first-degree AV block and possible intermittent third-degree AV block, IVCD, 1 mm of ST elevation in V1 and V2 and reciprocal ST depression in the inferior leads Telemetry:  Telemetry was personally reviewed and demonstrates:    Relevant CV Studies:   Laboratory Data:  High Sensitivity Troponin:   Recent Labs  Lab 08/05/22 2145  TROPONINIHS 15     Chemistry Recent Labs  Lab 08/05/22 2145  NA 138  K 3.7  CL 107  CO2 19*  GLUCOSE 169*  BUN 22*  CREATININE 1.12  CALCIUM 8.7*  GFRNONAA >60  ANIONGAP 12    Recent Labs  Lab 08/05/22 2145  PROT 7.4  ALBUMIN 3.6  AST 108*  ALT 68*  ALKPHOS 69  BILITOT 0.8   Lipids  Recent Labs  Lab 08/05/22 2145  CHOL 155  TRIG 78  HDL 58  LDLCALC 81  CHOLHDL 2.7    Hematology Recent Labs  Lab 08/05/22 2145  WBC 7.4  RBC 4.47  HGB 13.9  HCT 43.8  MCV 98.0  MCH 31.1  MCHC 31.7  RDW 11.4*  PLT 214   Thyroid No results for input(s): "TSH", "FREET4" in the last 168 hours.  BNPNo results for input(s): "BNP", "PROBNP" in the last 168 hours.  DDimer No results for input(s): "DDIMER" in the last 168 hours.   Radiology/Studies:  CARDIAC  CATHETERIZATION  Result Date: 08/05/2022   Prox RCA lesion is 40% stenosed.   Ost LM to Mid LM lesion is 95% stenosed.   A drug-eluting stent was successfully placed using a STENT ONYX FRONTIER 3.5X12.   Post intervention, there is a 0% residual stenosis.   There is severe left ventricular systolic dysfunction.   LV end diastolic pressure is mildly elevated.   The left ventricular ejection fraction is less than 25% by visual estimate. 1.  Severe ostial left main stenosis with hazy appearance highly suggestive of acute plaque  rupture.  No other obstructive disease. 2.  Severely reduced LV systolic function with an EF of 20% with akinesia of the mid to distal anterior, apical and infero-Apical myocardium. 3.  Successful angioplasty and drug-eluting stent placement to the ostial left main coronary artery. Recommendations: Dual antiplatelet therapy for at least 12 months. No beta-blockers for now due to bradycardia.  Monitor heart rate and blood pressure closely as the patient is at risk for decompensation given degree of myocardium at risk. Delays and door to device time related to language barrier and the difficulty of obtaining an interpreter.     Assessment and Plan:   Anterior ST elevation myocardial infarction: Emergent cardiac catheterization was performed which showed evidence of plaque rupture in the ostial left main coronary artery but the vessel was not occluded and there was flow down the LAD and left circumflex.  No other obstructive disease.  I performed successful angioplasty and drug-eluting stent placement to the ostial left main coronary artery.  Recommend dual antiplatelet therapy for at least 12 months and preferably longer.  Recommend aggressive treatment of risk factors. 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  The patient's systolic blood pressure throughout cardiac catheterization was  greater than 100.  Will have to monitor for any signs of hemodynamic decompensation but I am hopeful for myocardial recovery given that the vessel was not occluded and revascularization was performed quickly.  No beta-blocker for now given bradycardia and no ACE inhibitor or ARB due to soft blood pressure.  Obtain an echocardiogram.  LVEDP was only 20.  Will consider 1 dose of IV Lasix if needed based on his respiratory status. High-grade AV block: The patient's heart rate improved after stent placement but he continued to have intermittent bradycardia with stable blood pressure overall.  I elected not to place a temporary pacemaker and will monitor his pulse closely.  We could consider a small dose dopamine if needed to augment his heart rate.   Risk Assessment/Risk Scores:     TIMI Risk Score for ST  Elevation MI:   The patient's TIMI risk score is 5, which indicates a 12.4% risk of all cause mortality at 30 days.{    For questions or updates, please contact [place] Please consult [email or web address] for contact info under    Signed, Kathlyn Sacramento, MD  08/05/2022 11:55 PM

## 2022-08-05 NOTE — ED Provider Notes (Signed)
[facility] Provider Note    None    (approximate)   History   Chief Complaint: Chest Pain  History obtained with [place] telephone interpreter HPI  Bayden Jeannot is a 62 y.o. 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  Patient does not recall any preceding symptoms.  He had loss of consciousness, and bystanders performed CPR while calling 911.  EMS arrived, and the patient was conscious but complaining of chest pain shortness of breath.  He was diaphoretic and pale and gray.  Initial EKG showed some inferior ST changes and EMS activated code STEMI during transport.  They gave 324 mg of aspirin.  Patient reports persistent pronounced chest tightness in the center of the chest.  He feels dizzy.     Physical Exam   Triage Vital Signs: ED Triage Vitals  Enc Vitals Group     BP 08/05/22 2145 96/69     Pulse Rate 08/05/22 2149 (!) 50     Resp 08/05/22 2145 20     Temp --      Temp src --      SpO2 08/05/22 2149 100 %     Weight --      Height --      Head Circumference --      Peak Flow --      Pain Score --      Pain Loc --      Pain Edu? --      Excl. in [place]? --     Most recent vital signs: Vitals:   08/05/22 2205 08/05/22 2205  BP: 109/63   Pulse:  (!) 35  Resp:  18  SpO2:  100%    General: Awake, ill-appearing thin male CV:  Hands and feet cool, thready peripheral pulses.  Bradycardia, heart rate 50, irregular rhythm Resp:  Normal effort.  Clear to auscultation bilaterally Abd:  No distention.  Soft nontender Other:  No lower extremity edema, no signs of trauma.  Neurologically intact   ED Results / Procedures / Treatments   Labs (all labs ordered are listed, but only abnormal results are displayed) Labs Reviewed  CBC WITH DIFFERENTIAL/PLATELET - Abnormal; Notable for the following components:      Result Value   RDW 11.4 (*)    All  other components within normal limits  PROTIME-INR  APTT  COMPREHENSIVE METABOLIC PANEL  LIPID PANEL  TROPONIN I (HIGH SENSITIVITY)     EKG Interpreted by me Atrial fibrillation, rate of 47.  Right axis, ST elevation in V1 and V2 with ST depression in 2 and 3.   RADIOLOGY    PROCEDURES:  .Critical Care  Performed by: Carrie Mew, MD Authorized by: Carrie Mew, MD   Critical care provider statement:    Critical care time (minutes):  30   Critical care time was exclusive of:  Separately billable procedures and treating other patients   Critical care was necessary to treat or prevent imminent or life-threatening deterioration of the following conditions:  Cardiac failure, circulatory failure and shock   Critical care was time spent personally by me on the following activities:  Development of treatment plan with patient or surrogate, discussions with consultants, evaluation of patient's response to treatment, examination of patient, obtaining history from patient or surrogate, ordering and performing treatments and interventions, ordering and review of laboratory studies, ordering and review of radiographic  studies, pulse oximetry, re-evaluation of patient's condition and review of old charts   Care discussed with: admitting provider      [place] ED: Medications  0.9 %  sodium chloride infusion ( Intravenous New Bag/Given 08/05/22 2204)     IMPRESSION / MDM / [place] / ED COURSE  I reviewed the triage vital signs and the nursing notes.  DDx: ACS, GERD, pneumothorax, unstable arrhythmia, electrolyte abnormality, anemia, pancreatitis  Patient's presentation is most consistent with acute presentation with potential threat to life or bodily function.  Patient presents with syncope, possible out of hospital cardiac arrest, EKG concerning for STEMI.  Patient seen on arrival by EMS, cardiology Dr. Sophronia Simas arrived within just a few minutes as  well.  Decided to take the patient to the Cath Lab for emergent cardiac catheterization to which patient agrees.  Patient was noted to have blood pressure of 85/50, saline bolus started.  Patient requested we inform his niece, who arrived to the ED.  She notes that she works third shift and can be available for interpreting for the patient as needed which he would prefer. Her name is Learta Codding, phone # [phone].  I gave his cell phone to Ms. Jeter.       FINAL CLINICAL IMPRESSION(S) / ED DIAGNOSES   Final diagnoses:  ST elevation myocardial infarction (STEMI), unspecified artery (HCC)  Precordial pain  Syncope and collapse     Rx / DC Orders   ED Discharge Orders     None        Note:  This document was prepared using Dragon voice recognition software and may include unintentional dictation errors.   Carrie Mew, MD 08/05/22 2225

## 2022-08-05 NOTE — ED Triage Notes (Signed)
Patient brought in via medic for call to job for report of not being conscience. Patient received CPR from bystander and placed on NR by fire. Patient found to be lethargic and diaphoretic by medic with abnormal EKG.

## 2022-08-05 NOTE — ED Notes (Signed)
Cardiologist at bed side

## 2022-08-06 ENCOUNTER — Inpatient Hospital Stay: Payer: Medicaid Other

## 2022-08-06 ENCOUNTER — Other Ambulatory Visit: Payer: Self-pay

## 2022-08-06 ENCOUNTER — Inpatient Hospital Stay: Payer: 59

## 2022-08-06 ENCOUNTER — Other Ambulatory Visit (HOSPITAL_COMMUNITY): Payer: Self-pay

## 2022-08-06 ENCOUNTER — Inpatient Hospital Stay (HOSPITAL_COMMUNITY)
Admit: 2022-08-06 | Discharge: 2022-08-06 | Disposition: A | Discharge status: Home / Self Care | Payer: Medicaid Other | Attending: Cardiovascular Disease | Admitting: Cardiovascular Disease

## 2022-08-06 ENCOUNTER — Encounter: Payer: Self-pay | Admitting: Internal Medicine

## 2022-08-06 DIAGNOSIS — E872 Acidosis, unspecified: Secondary | ICD-10-CM | POA: Insufficient documentation

## 2022-08-06 DIAGNOSIS — R7989 Other specified abnormal findings of blood chemistry: Secondary | ICD-10-CM | POA: Insufficient documentation

## 2022-08-06 DIAGNOSIS — R079 Chest pain, unspecified: Secondary | ICD-10-CM | POA: Diagnosis not present

## 2022-08-06 DIAGNOSIS — I213 ST elevation (STEMI) myocardial infarction of unspecified site: Secondary | ICD-10-CM | POA: Diagnosis not present

## 2022-08-06 DIAGNOSIS — Z9861 Coronary angioplasty status: Secondary | ICD-10-CM

## 2022-08-06 DIAGNOSIS — I2510 Atherosclerotic heart disease of native coronary artery without angina pectoris: Secondary | ICD-10-CM

## 2022-08-06 DIAGNOSIS — R001 Bradycardia, unspecified: Secondary | ICD-10-CM | POA: Insufficient documentation

## 2022-08-06 DIAGNOSIS — I249 Acute ischemic heart disease, unspecified: Secondary | ICD-10-CM

## 2022-08-06 DIAGNOSIS — I509 Heart failure, unspecified: Secondary | ICD-10-CM

## 2022-08-06 DIAGNOSIS — R55 Syncope and collapse: Secondary | ICD-10-CM | POA: Insufficient documentation

## 2022-08-06 LAB — CBC
HCT: 41 % (ref 39.0–52.0)
Hemoglobin: 13.5 g/dL (ref 13.0–17.0)
MCH: 31.2 pg (ref 26.0–34.0)
MCHC: 32.9 g/dL (ref 30.0–36.0)
MCV: 94.7 fL (ref 80.0–100.0)
Platelets: 208 10*3/uL (ref 150–400)
RBC: 4.33 MIL/uL (ref 4.22–5.81)
RDW: 11.4 % — ABNORMAL LOW (ref 11.5–15.5)
WBC: 8.4 10*3/uL (ref 4.0–10.5)
nRBC: 0 % (ref 0.0–0.2)

## 2022-08-06 LAB — ECHOCARDIOGRAM COMPLETE
AR max vel: 2.55 cm2
AV Area VTI: 3.39 cm2
AV Area mean vel: 2.23 cm2
AV Mean grad: 2 mmHg
AV Peak grad: 3.2 mmHg
Ao pk vel: 0.9 m/s
Area-P 1/2: 4.73 cm2
Height: 63 in
S' Lateral: 3.2 cm
Weight: 2084.67 oz

## 2022-08-06 LAB — POCT ACTIVATED CLOTTING TIME: Activated Clotting Time: 277 seconds

## 2022-08-06 LAB — COMPREHENSIVE METABOLIC PANEL
ALT: 76 U/L — ABNORMAL HIGH (ref 0–44)
AST: 79 U/L — ABNORMAL HIGH (ref 15–41)
Albumin: 3.8 g/dL (ref 3.5–5.0)
Alkaline Phosphatase: 69 U/L (ref 38–126)
Anion gap: 8 (ref 5–15)
BUN: 17 mg/dL (ref 6–20)
CO2: 23 mmol/L (ref 22–32)
Calcium: 9.1 mg/dL (ref 8.9–10.3)
Chloride: 109 mmol/L (ref 98–111)
Creatinine, Ser: 0.75 mg/dL (ref 0.61–1.24)
GFR, Estimated: >60 mL/min (ref >60)
Glucose, Bld: 107 mg/dL — ABNORMAL HIGH (ref 70–99)
Potassium: 3.5 mmol/L (ref 3.5–5.1)
Sodium: 140 mmol/L (ref 135–145)
Total Bilirubin: 1.2 mg/dL (ref 0.3–1.2)
Total Protein: 7.6 g/dL (ref 6.5–8.1)

## 2022-08-06 LAB — GLUCOSE, CAPILLARY
Glucose-Capillary: 102 mg/dL — ABNORMAL HIGH (ref 70–99)
Glucose-Capillary: 111 mg/dL — ABNORMAL HIGH (ref 70–99)
Glucose-Capillary: 139 mg/dL — ABNORMAL HIGH (ref 70–99)
Glucose-Capillary: 93 mg/dL (ref 70–99)

## 2022-08-06 LAB — BRAIN NATRIURETIC PEPTIDE: B Natriuretic Peptide: 191.1 pg/mL — ABNORMAL HIGH (ref 0.0–100.0)

## 2022-08-06 LAB — TROPONIN I (HIGH SENSITIVITY)
Troponin I (High Sensitivity): 389 ng/L (ref <18)
Troponin I (High Sensitivity): 548 ng/L (ref <18)

## 2022-08-06 LAB — MAGNESIUM: Magnesium: 2.1 mg/dL (ref 1.7–2.4)

## 2022-08-06 LAB — PHOSPHORUS: Phosphorus: 3.2 mg/dL (ref 2.5–4.6)

## 2022-08-06 LAB — HIV ANTIBODY (ROUTINE TESTING W REFLEX): HIV Screen 4th Generation wRfx: NONREACTIVE

## 2022-08-06 MED ORDER — INSULIN ASPART 100 UNIT/ML IJ SOLN: 0.0000 [IU] | Freq: Every day | INTRAMUSCULAR | Status: DC

## 2022-08-06 MED ORDER — ENOXAPARIN SODIUM 40 MG/0.4ML IJ SOSY
40.0000 mg | PREFILLED_SYRINGE | INTRAMUSCULAR | Status: DC
  Administered 2022-08-06 – 2022-08-09 (×4): 40 mg via SUBCUTANEOUS
  Filled 2022-08-06 (×4): qty 0.4

## 2022-08-06 MED ORDER — POTASSIUM CHLORIDE CRYS ER 20 MEQ PO TBCR
40.0000 meq | EXTENDED_RELEASE_TABLET | Freq: Once | ORAL | Status: AC
  Administered 2022-08-06: 40 meq via ORAL
  Filled 2022-08-06: qty 2

## 2022-08-06 MED ORDER — KCL IN DEXTROSE-NACL 10-5-0.45 MEQ/L-%-% IV SOLN
INTRAVENOUS | Status: DC
  Filled 2022-08-06 (×6): qty 1000

## 2022-08-06 MED ORDER — ONDANSETRON HCL 4 MG/2ML IJ SOLN: 4.0000 mg | Freq: Four times a day (QID) | INTRAMUSCULAR | Status: DC | PRN

## 2022-08-06 MED ORDER — INSULIN ASPART 100 UNIT/ML IJ SOLN: 0.0000 [IU] | Freq: Three times a day (TID) | INTRAMUSCULAR | Status: DC

## 2022-08-06 MED ORDER — SODIUM CHLORIDE 0.9% FLUSH
3.0000 mL | Freq: Two times a day (BID) | INTRAVENOUS | Status: DC
  Administered 2022-08-06 – 2022-08-09 (×8): 3 mL via INTRAVENOUS

## 2022-08-06 MED ORDER — CHLORHEXIDINE GLUCONATE CLOTH 2 % EX PADS
6.0000 | MEDICATED_PAD | Freq: Every day | CUTANEOUS | Status: DC
  Administered 2022-08-06 – 2022-08-07 (×2): 6 via TOPICAL

## 2022-08-06 MED ORDER — SODIUM CHLORIDE 0.9% FLUSH: 3.0000 mL | INTRAVENOUS | Status: DC | PRN

## 2022-08-06 MED ORDER — ACETAMINOPHEN 650 MG RE SUPP: 650.0000 mg | Freq: Four times a day (QID) | RECTAL | Status: DC | PRN

## 2022-08-06 MED ORDER — SODIUM CHLORIDE 0.9 % IV SOLN: 250.0000 mL | INTRAVENOUS | Status: DC | PRN

## 2022-08-06 MED ORDER — ATORVASTATIN CALCIUM 80 MG PO TABS
80.0000 mg | ORAL_TABLET | Freq: Every day | ORAL | Status: DC
  Administered 2022-08-06 – 2022-08-10 (×5): 80 mg via ORAL
  Filled 2022-08-06: qty 4
  Filled 2022-08-06 (×2): qty 1
  Filled 2022-08-06: qty 4
  Filled 2022-08-06: qty 1

## 2022-08-06 MED ORDER — ACETAMINOPHEN 325 MG PO TABS: 650.0000 mg | ORAL_TABLET | ORAL | Status: DC | PRN

## 2022-08-06 MED ORDER — ACETAMINOPHEN 325 MG PO TABS
650.0000 mg | ORAL_TABLET | Freq: Four times a day (QID) | ORAL | Status: DC | PRN
  Administered 2022-08-06: 650 mg via ORAL
  Filled 2022-08-06: qty 2

## 2022-08-06 NOTE — Assessment & Plan Note (Addendum)
Orders have been put in by cardiology in this regard in terms of antiplatelet therapy statin.  Follow-up hemoglobin A1c.  Patient is not a candidate for beta-blocker or any agent that might reduce blood pressure at this time groin dressing as per cardiology

## 2022-08-06 NOTE — Assessment & Plan Note (Signed)
Likely due to hypoperfusion of the liver during syncope/hypotensive episode.  We will monitor

## 2022-08-06 NOTE — H&P (Signed)
History and Physical    PatientDarail Reid [id number] DOB: 11/14/1960 DOA: 08/05/2022 DOS: the patient was seen and examined on 08/06/2022 PCP: Gregor Hams, FNP  Patient coming from:  Patient was working in a Engineer, site Complaint:  Chief Complaint  Patient presents with   Chest Pain   HPI: Craig Reid is a 62 y.o. male who speaks [place].  Patient was interviewed via [place] interpreter as well as by his niece at the bedside.  History was obtained from the patient as well as from secondary sources.  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  Patient reports 15-minute episode of anterior chest discomfort/pain associated with sensation of shortness of breath, sweating and marked fatigue/presyncope.  Patient does not recall palpitations vomiting diarrhea fever or coughing.  Patient does not recall loss of consciousness however secondary sources (mainly bystanders) report that the patient lost consciousness and apparently pulseless and had bystander CPR at the site.  By the time EMS arrived, patient had regained consciousness and was complaining of chest pain.  Patient is brought to [facility] health ER earlier this evening at approximately 9 PM  EMS/ER evaluation was noted for some inferior ST changes and therefore patient was taken to the coronary angiography lab.  I spoke with Dr. Arida/cardiology who advised that the patient was found to have plaque rupture changes left main on his coronary.  Angiography.  Patient has had left main coronary artery drug-eluting stent placed.  Intraprocedural ventriculography is notable for severely depressed ejection fraction of 20% with akinesia of the mid to distal anterior apical and inferoapical myocardium  Patient is now seen in the ICU and is awake, has family at bedside.  Patient reports marked improvement in his symptoms in terms of chest pain shortness of breath and fatigue.  However he  still feels fatigued.  Patient is adhering to bedrest as advised by staff  Review of Systems: As mentioned in the history of present illness. All other systems reviewed and are negative. No past medical history on file. Past Surgical History:  Procedure Laterality Date   LAPAROTOMY N/A 04/21/2019   Procedure: EXPLORATORY LAPAROTOMY, gastric ulcer repair;  Surgeon: Benjamine Sprague, DO;  Location: [facility] ORS;  Service: General;  Laterality: N/A;   Social History:  reports that he has never smoked. He has never used smokeless tobacco. He reports that he does not drink alcohol. No history on file for drug use.  No Known Allergies  No family history on file.  Prior to Admission medications   Medication Sig Start Date End Date Taking? Authorizing Provider  HYDROcodone-acetaminophen (NORCO) 5-325 MG tablet Take 1 tablet by mouth every 6 (six) hours as needed for up to 6 doses for moderate pain. 04/27/19   Lysle Pearl, Isami, DO  ibuprofen (ADVIL) 800 MG tablet Take 1 tablet (800 mg total) by mouth every 8 (eight) hours as needed for mild pain or moderate pain. 04/27/19   Benjamine Sprague, DO  Multiple Vitamin (MULTIVITAMIN WITH MINERALS) TABS tablet Take 1 tablet by mouth daily.    [provider]  pantoprazole (PROTONIX) 40 MG tablet Take 1 tablet (40 mg total) by mouth daily. 04/27/19 04/26/20  Benjamine Sprague, DO    Physical Exam: Vitals:   08/05/22 2311 08/05/22 2330 08/05/22 2345 08/06/22 0000  BP: (!) 146/101 99/85 121/65 119/73  Pulse: 70 (!) 44 (!) 25 68  Resp: (!) 21 (!) '26 18 19  '$ Temp:    98.6 F (37 C)  TempSrc:    Oral  SpO2: 100% 100% 100% 100%   Does not appear to be in any distress, however is very soft-spoken and slow to respond and seems fatigued.  Thin man. Respiratory exam: Bilateral intravesicular Cardiovascular exam S1-S2 normal Abdomen soft nontender scaphoid Groin right groin dressing in place no marked hematoma noted Extremities warm without edema distal function  intact. Data Reviewed:  Labs on Admission:  Results for orders placed or performed during the hospital encounter of 08/05/22 (from the past 24 hour(s))  CBC with Differential/Platelet     Status: Abnormal   Collection Time: 08/05/22  9:45 PM  Result Value Ref Range   WBC 7.4 4.0 - 10.5 K/uL   RBC 4.47 4.22 - 5.81 MIL/uL   Hemoglobin 13.9 13.0 - 17.0 g/dL   HCT 43.8 39.0 - 52.0 %   MCV 98.0 80.0 - 100.0 fL   MCH 31.1 26.0 - 34.0 pg   MCHC 31.7 30.0 - 36.0 g/dL   RDW 11.4 (L) 11.5 - 15.5 %   Platelets 214 150 - 400 K/uL   nRBC 0.0 0.0 - 0.2 %   Neutrophils Relative % 54 %   Neutro Abs 4.0 1.7 - 7.7 K/uL   Lymphocytes Relative 37 %   Lymphs Abs 2.7 0.7 - 4.0 K/uL   Monocytes Relative 6 %   Monocytes Absolute 0.5 0.1 - 1.0 K/uL   Eosinophils Relative 1 %   Eosinophils Absolute 0.1 0.0 - 0.5 K/uL   Basophils Relative 1 %   Basophils Absolute 0.0 0.0 - 0.1 K/uL   Immature Granulocytes 1 %   Abs Immature Granulocytes 0.04 0.00 - 0.07 K/uL  Protime-INR     Status: None   Collection Time: 08/05/22  9:45 PM  Result Value Ref Range   Prothrombin Time 13.6 11.4 - 15.2 seconds   INR 1.1 0.8 - 1.2  APTT     Status: None   Collection Time: 08/05/22  9:45 PM  Result Value Ref Range   aPTT 34 24 - 36 seconds  Comprehensive metabolic panel     Status: Abnormal   Collection Time: 08/05/22  9:45 PM  Result Value Ref Range   Sodium 138 135 - 145 mmol/L   Potassium 3.7 3.5 - 5.1 mmol/L   Chloride 107 98 - 111 mmol/L   CO2 19 (L) 22 - 32 mmol/L   Glucose, Bld 169 (H) 70 - 99 mg/dL   BUN 22 (H) 6 - 20 mg/dL   Creatinine, Ser 1.12 0.61 - 1.24 mg/dL   Calcium 8.7 (L) 8.9 - 10.3 mg/dL   Total Protein 7.4 6.5 - 8.1 g/dL   Albumin 3.6 3.5 - 5.0 g/dL   AST 108 (H) 15 - 41 U/L   ALT 68 (H) 0 - 44 U/L   Alkaline Phosphatase 69 38 - 126 U/L   Total Bilirubin 0.8 0.3 - 1.2 mg/dL   GFR, Estimated >60 >60 mL/min   Anion gap 12 5 - 15  Troponin I (High Sensitivity)     Status: None   Collection  Time: 08/05/22  9:45 PM  Result Value Ref Range   Troponin I (High Sensitivity) 15 <18 ng/L  Lipid panel     Status: None   Collection Time: 08/05/22  9:45 PM  Result Value Ref Range   Cholesterol 155 0 - 200 mg/dL   Triglycerides 78 <150 mg/dL   HDL 58 >40 mg/dL   Total CHOL/HDL Ratio 2.7 RATIO   VLDL 16 0 -  40 mg/dL   LDL Cholesterol 81 0 - 99 mg/dL  Glucose, capillary     Status: None   Collection Time: 08/05/22 11:34 PM  Result Value Ref Range   Glucose-Capillary 93 70 - 99 mg/dL   Basic Metabolic Panel:  Liver Function Tests:  No results for input(s): "LIPASE", "AMYLASE" in the last 168 hours. No results for input(s): "AMMONIA" in the last 168 hours. CBC:  Cardiac Enzymes: Recent Labs  Lab 08/05/22 2145  TROPONINIHS 15    BNP (last 3 results) No results for input(s): "PROBNP" in the last 8760 hours. CBG: Recent Labs  Lab 08/05/22 2334  GLUCAP 93    Radiological Exams on Admission:  CARDIAC CATHETERIZATION  Result Date: 08/05/2022   Prox RCA lesion is 40% stenosed.   Ost LM to Mid LM lesion is 95% stenosed.   A drug-eluting stent was successfully placed using a STENT ONYX FRONTIER 3.5X12.   Post intervention, there is a 0% residual stenosis.   There is severe left ventricular systolic dysfunction.   LV end diastolic pressure is mildly elevated.   The left ventricular ejection fraction is less than 25% by visual estimate. 1.  Severe ostial left main stenosis with hazy appearance highly suggestive of acute plaque rupture.  No other obstructive disease. 2.  Severely reduced LV systolic function with an EF of 20% with akinesia of the mid to distal anterior, apical and infero-Apical myocardium. 3.  Successful angioplasty and drug-eluting stent placement to the ostial left main coronary artery. Recommendations: Dual antiplatelet therapy for at least 12 months. No beta-blockers for now due to bradycardia.  Monitor heart rate and blood pressure closely as the patient is at  risk for decompensation given degree of myocardium at risk. Delays and door to device time related to language barrier and the difficulty of obtaining an interpreter.    EKG: Independently reviewed. Shows sinus brady with junctional escape at 9:48 PM EKG at 9:44 shows sinus pause with junctional escape and pvc   CXR ordered   Assessment and Plan: ACS (acute coronary syndrome) (HCC) Strong suspicion of left main plaque rupture that has been treated with drug-eluting stent placement.  Trend troponins  Acute CHF (congestive heart failure) (HCC) Evident by patient's left ventriculography in Cath Lab.  Echo pending.  At this time not a candidate for beta-blockers or ACE inhibitors.  Maintain on telemetry given significant reduction in ejection fraction.  Etiology is felt to be coronary artery disease  LFT elevation Likely due to hypoperfusion of the liver during syncope/hypotensive episode.  We will monitor  Acidosis Mild in the setting of receiving several fluids that are relatively acidotic.  At this time I will trend.  CAD S/P percutaneous coronary angioplasty Orders have been put in by cardiology in this regard in terms of antiplatelet therapy statin.  Follow-up hemoglobin A1c.  Patient is not a candidate for beta-blocker or any agent that might reduce blood pressure at this time groin dressing as per cardiology  Bradycardia Ending with clinical monitoring to see if this resolves on its own as the myocardium is better perfused after an PCI or patient needs pacemaker.  Serial EKGs are pending to monitor rhythm  Syncope Given the findings of significant cardiac dysfunction in terms of LV function as well as bradycardia at this time as well as intra coronary angiography findings, I do suspect that this was precipitated by an acute coronary syndrome.  Will check lower extremity Dopplers to rule out occult VTE.  Maintain on  telemetry at this time      Advance Care Planning:   Code Status:  Prior full code  Consults: Cardiology already on the case  Family Communication: Niece at bedside  Severity of Illness: The appropriate patient status for this patient is INPATIENT. Inpatient status is judged to be reasonable and necessary in order to provide the required intensity of service to ensure the patient's safety. The patient's presenting symptoms, physical exam findings, and initial radiographic and laboratory data in the context of their chronic comorbidities is felt to place them at high risk for further clinical deterioration. Furthermore, it is not anticipated that the patient will be medically stable for discharge from the hospital within 2 midnights of admission.   * I certify that at the point of admission it is my clinical judgment that the patient will require inpatient hospital care spanning beyond 2 midnights from the point of admission due to high intensity of service, high risk for further deterioration and high frequency of surveillance required.*  Author: Gertie Fey, MD 08/06/2022 12:06 AM  For on call review [email or web address].

## 2022-08-06 NOTE — Assessment & Plan Note (Signed)
Strong suspicion of left main plaque rupture that has been treated with drug-eluting stent placement.  Trend troponins

## 2022-08-06 NOTE — Progress Notes (Signed)
*  PRELIMINARY RESULTS* Echocardiogram 2D Echocardiogram has been performed.  Craig Reid 08/06/2022, 9:29 AM

## 2022-08-06 NOTE — Assessment & Plan Note (Signed)
Ending with clinical monitoring to see if this resolves on its own as the myocardium is better perfused after an PCI or patient needs pacemaker.  Serial EKGs are pending to monitor rhythm

## 2022-08-06 NOTE — TOC Benefit Eligibility Note (Signed)
Patient Teacher, English as a foreign language completed.    The patient is currently admitted and upon discharge could be taking clopidogrel (Plavix) 75 mg.  The current 30 day co-pay is $4.00.   The patient is currently admitted and upon discharge could be taking prasugrel (Effient) 75 mg.  The current 30 day co-pay is $4.00.   The patient is currently admitted and upon discharge could be taking Brilinta 90 mg.  Product Not Covered  The patient is insured through [employer] and [employer] [place] [employer]  Lyndel Safe, [place] Patient Advocate Specialist [place] Patient Advocate Team Direct Number: [phone]  Fax: [phone]

## 2022-08-06 NOTE — Progress Notes (Signed)
Anticoagulation monitoring(Lovenox):  62 yo male ordered Lovenox 30 mg Q24h    Filed Weights   08/06/22 0500  Weight: 59.1 kg (130 lb 4.7 oz)   BMI 23   Lab Results  Component Value Date   CREATININE 1.12 08/05/2022   CREATININE 1.08 04/26/2019   CREATININE 1.17 04/23/2019   Estimated Creatinine Clearance: 57.2 mL/min (by C-G formula based on SCr of 1.12 mg/dL). Hemoglobin & Hematocrit     Component Value Date/Time   HGB 13.9 08/05/2022 2145   HCT 43.8 08/05/2022 2145     Per Protocol for Patient with estCrcl > 30 ml/min and BMI > 30, will transition to Lovenox 40 mg Q24h.

## 2022-08-06 NOTE — Assessment & Plan Note (Signed)
Evident by patient's left ventriculography in Cath Lab.  Echo pending.  At this time not a candidate for beta-blockers or ACE inhibitors.  Maintain on telemetry given significant reduction in ejection fraction.  Etiology is felt to be coronary artery disease

## 2022-08-06 NOTE — Progress Notes (Signed)
Rounding Note    Patient Name: Craig Reid Date of Encounter: 08/06/2022  [place] Cardiologist: Kathlyn Sacramento, MD   Subjective   The patient is much better with no chest pain or shortness of breath.  He continues to have intermittent high-grade AV block that does not sustain for long.  He is asymptomatic from that.  Inpatient Medications    Scheduled Meds:  aspirin  81 mg Oral Daily   atorvastatin  80 mg Oral Daily   enoxaparin (LOVENOX) injection  40 mg Subcutaneous Q24H   insulin aspart  0-5 Units Subcutaneous QHS   insulin aspart  0-9 Units Subcutaneous TID WC   sodium chloride flush  3 mL Intravenous Q12H   sodium chloride flush  3 mL Intravenous Q12H   ticagrelor  90 mg Oral BID   Continuous Infusions:  sodium chloride 20 mL/hr at 08/06/22 1300   sodium chloride     dextrose 5 % and 0.45 % NaCl with KCl 10 mEq/L 75 mL/hr at 08/06/22 1300   PRN Meds: sodium chloride, acetaminophen **OR** acetaminophen, ondansetron (ZOFRAN) IV, sodium chloride flush   Vital Signs    Vitals:   08/06/22 1000 08/06/22 1100 08/06/22 1200 08/06/22 1300  BP: 114/68 108/63 116/69 114/67  Pulse: (!) 40 (!) 41 (!) 41 (!) 39  Resp: (!) '23 17 15 13  '$ Temp:      TempSrc:      SpO2: 100% 99% 100% 100%  Weight:      Height:        Intake/Output Summary (Last 24 hours) at 08/06/2022 1345 Last data filed at 08/06/2022 1300 Gross per 24 hour  Intake 823.67 ml  Output 1300 ml  Net -476.33 ml      08/06/2022    5:00 AM 04/21/2019    8:10 PM  Last 3 Weights  Weight (lbs) 130 lb 4.7 oz 110 lb  Weight (kg) 59.1 kg 49.896 kg      Telemetry    Sinus versus ectopic atrial rhythm with intermittent high-grade  AV block.- Personally Reviewed  ECG     - Personally Reviewed  Physical Exam   GEN: No acute distress.   Neck: No JVD Cardiac: RRR, no murmurs, rubs, or gallops.  Respiratory: Clear to auscultation bilaterally. GI: Soft, nontender, non-distended  MS: No  edema; No deformity. Neuro:  Nonfocal  Psych: Normal affect  Right groin is intact with no hematoma.  Labs    High Sensitivity Troponin:   Recent Labs  Lab 08/05/22 2145 08/06/22 0012 08/06/22 0506  TROPONINIHS 15 389* 548*     Chemistry Recent Labs  Lab 08/05/22 2145 08/06/22 0504 08/06/22 0506  NA 138  --  140  K 3.7  --  3.5  CL 107  --  109  CO2 19*  --  23  GLUCOSE 169*  --  107*  BUN 22*  --  17  CREATININE 1.12  --  0.75  CALCIUM 8.7*  --  9.1  MG  --  2.1  --   PROT 7.4  --  7.6  ALBUMIN 3.6  --  3.8  AST 108*  --  79*  ALT 68*  --  76*  ALKPHOS 69  --  69  BILITOT 0.8  --  1.2  GFRNONAA >60  --  >60  ANIONGAP 12  --  8    Lipids  Recent Labs  Lab 08/05/22 2145  CHOL 155  TRIG 78  HDL 58  LDLCALC  81  CHOLHDL 2.7    Hematology Recent Labs  Lab 08/05/22 2145 08/06/22 0506  WBC 7.4 8.4  RBC 4.47 4.33  HGB 13.9 13.5  HCT 43.8 41.0  MCV 98.0 94.7  MCH 31.1 31.2  MCHC 31.7 32.9  RDW 11.4* 11.4*  PLT 214 208   Thyroid No results for input(s): "TSH", "FREET4" in the last 168 hours.  BNP Recent Labs  Lab 08/06/22 0504  BNP 191.1*    DDimer No results for input(s): "DDIMER" in the last 168 hours.   Radiology    ECHOCARDIOGRAM COMPLETE  Result Date: 08/06/2022    ECHOCARDIOGRAM REPORT   Patient Name:   Craig Reid Date of Exam: 08/06/2022 Medical Rec #:  [id number]            Height:       63.0 in Accession #:    [id number]           Weight:       130.3 lb Date of Birth:  08-27-1960             BSA:          1.612 m Patient Age:    62 years             BP:           114/68 mmHg Patient Gender: M                    HR:           125 bpm. Exam Location:  [facility] Procedure: 2D Echo, Cardiac Doppler and Color Doppler Indications:     Acute myocardial infarction I21.9  History:         Patient has no prior history of Echocardiogram examinations. No                  past medical history on file.  Sonographer:     Sherrie Sport Referring Phys:  [id number] A Amias Hutchinson Diagnosing Phys: Kathlyn Sacramento MD IMPRESSIONS  1. Left ventricular ejection fraction, by estimation, is 40 to 45%. The left ventricle has mildly decreased function. The left ventricle demonstrates global hypokinesis. Left ventricular diastolic parameters are indeterminate.  2. Right ventricular systolic function is normal. The right ventricular size is normal. There is normal pulmonary artery systolic pressure.  3. The mitral valve is normal in structure. Mild mitral valve regurgitation. No evidence of mitral stenosis.  4. The aortic valve is normal in structure. Aortic valve regurgitation is trivial. Aortic valve sclerosis is present, with no evidence of aortic valve stenosis. FINDINGS  Left Ventricle: Left ventricular ejection fraction, by estimation, is 40 to 45%. The left ventricle has mildly decreased function. The left ventricle demonstrates global hypokinesis. The left ventricular internal cavity size was normal in size. There is  borderline left ventricular hypertrophy. Left ventricular diastolic parameters are indeterminate. Right Ventricle: The right ventricular size is normal. No increase in right ventricular wall thickness. Right ventricular systolic function is normal. There is normal pulmonary artery systolic pressure. The tricuspid regurgitant velocity is 2.44 m/s, and  with an assumed right atrial pressure of 5 mmHg, the estimated right ventricular systolic pressure is 0000000 mmHg. Left Atrium: Left atrial size was normal in size. Right Atrium: Right atrial size was normal in size. Pericardium: There is no evidence of pericardial effusion. Mitral Valve: The mitral valve is normal in structure. Mild mitral valve regurgitation. No evidence of mitral valve stenosis. Tricuspid Valve: The tricuspid  valve is normal in structure. Tricuspid valve regurgitation is mild . No evidence of tricuspid stenosis. Aortic Valve: The aortic valve is normal in structure. Aortic valve regurgitation is  trivial. Aortic valve sclerosis is present, with no evidence of aortic valve stenosis. Aortic valve mean gradient measures 2.0 mmHg. Aortic valve peak gradient measures 3.2 mmHg. Aortic valve area, by VTI measures 3.39 cm. Pulmonic Valve: The pulmonic valve was normal in structure. Pulmonic valve regurgitation is trivial. No evidence of pulmonic stenosis. Aorta: The aortic root is normal in size and structure. Venous: The inferior vena cava was not well visualized. IAS/Shunts: No atrial level shunt detected by color flow Doppler.  LEFT VENTRICLE PLAX 2D LVIDd:         4.50 cm LVIDs:         3.20 cm LV PW:         1.10 cm LV IVS:        1.10 cm LVOT diam:     2.10 cm LV SV:         48 LV SV Index:   30 LVOT Area:     3.46 cm  RIGHT VENTRICLE RV Basal diam:  4.60 cm RV Mid diam:    3.80 cm RV S prime:     13.10 cm/s TAPSE (M-mode): 2.8 cm LEFT ATRIUM             Index        RIGHT ATRIUM           Index LA diam:        2.70 cm 1.68 cm/m   RA Area:     17.00 cm LA Vol (A2C):   66.6 ml 41.32 ml/m  RA Volume:   49.40 ml  30.65 ml/m LA Vol (A4C):   28.5 ml 17.68 ml/m LA Biplane Vol: 42.2 ml 26.18 ml/m  AORTIC VALVE AV Area (Vmax):    2.55 cm AV Area (Vmean):   2.23 cm AV Area (VTI):     3.39 cm AV Vmax:           89.90 cm/s AV Vmean:          66.000 cm/s AV VTI:            0.141 m AV Peak Grad:      3.2 mmHg AV Mean Grad:      2.0 mmHg LVOT Vmax:         66.30 cm/s LVOT Vmean:        42.500 cm/s LVOT VTI:          0.138 m LVOT/AV VTI ratio: 0.98  AORTA Ao Root diam: 3.30 cm MITRAL VALVE               TRICUSPID VALVE MV Area (PHT): 4.73 cm    TR Peak grad:   23.8 mmHg MV Decel Time: 161 msec    TR Vmax:        244.00 cm/s MV E velocity: 73.90 cm/s MV A velocity: 32.50 cm/s  SHUNTS MV E/A ratio:  2.27        Systemic VTI:  0.14 m                            Systemic Diam: 2.10 cm Kathlyn Sacramento MD Electronically signed by Kathlyn Sacramento MD Signature Date/Time: 08/06/2022/1:37:33 PM    Final    Portable chest 1  View  Result Date: 08/06/2022 CLINICAL DATA:  Coronary  artery disease. EXAM: PORTABLE CHEST 1 VIEW COMPARISON:  04/21/2019 FINDINGS: Single-view of the chest was obtained. Cardiac pads overlying the chest. Negative for a pneumothorax. No focal airspace disease and no overt pulmonary edema. Heart size is normal. The trachea is midline. Bony thorax is grossly intact. IMPRESSION: No focal chest disease. Electronically Signed   By: Markus Daft M.D.   On: 08/06/2022 07:47   US Venous Img Lower Bilateral (DVT)  Result Date: 08/06/2022 CLINICAL DATA:  62 year old male with lower extremity swelling. EXAM: BILATERAL LOWER EXTREMITY VENOUS DOPPLER ULTRASOUND TECHNIQUE: Gray-scale sonography with graded compression, as well as color Doppler and duplex ultrasound were performed to evaluate the lower extremity deep venous systems from the level of the common femoral vein and including the common femoral, femoral, profunda femoral, popliteal and calf veins including the posterior tibial, peroneal and gastrocnemius veins when visible. The superficial great saphenous vein was also interrogated. Spectral Doppler was utilized to evaluate flow at rest and with distal augmentation maneuvers in the common femoral, femoral and popliteal veins. COMPARISON:  None Available. FINDINGS: RIGHT LOWER EXTREMITY Common Femoral Vein: No evidence of thrombus. Normal compressibility, respiratory phasicity and response to augmentation. Saphenofemoral Junction: Limited visualization due to overlying bandage. Profunda Femoral Vein: No evidence of thrombus. Normal compressibility and flow on color Doppler imaging. Femoral Vein: No evidence of thrombus. Normal compressibility, respiratory phasicity and response to augmentation. Popliteal Vein: No evidence of thrombus. Normal compressibility, respiratory phasicity and response to augmentation. Calf Veins: No evidence of thrombus. Normal compressibility and flow on color Doppler imaging. Other Findings:   None. LEFT LOWER EXTREMITY Common Femoral Vein: No evidence of thrombus. Normal compressibility, respiratory phasicity and response to augmentation. Saphenofemoral Junction: Limited visualization due to overlying bandage. Profunda Femoral Vein: No evidence of thrombus. Normal compressibility and flow on color Doppler imaging. Femoral Vein: No evidence of thrombus. Normal compressibility, respiratory phasicity and response to augmentation. Popliteal Vein: No evidence of thrombus. Normal compressibility, respiratory phasicity and response to augmentation. Calf Veins: No evidence of thrombus. Normal compressibility and flow on color Doppler imaging. Other Findings:  None. IMPRESSION: No evidence of bilateral lower extremity deep venous thrombosis. Limited visualization of the bilateral saphenofemoral junctions due to overlying bandages. Ruthann Cancer, MD [facility] Radiology Electronically Signed   By: Ruthann Cancer M.D.   On: 08/06/2022 07:43   CARDIAC CATHETERIZATION  Result Date: 08/05/2022   Prox RCA lesion is 40% stenosed.   Ost LM to Mid LM lesion is 95% stenosed.   A drug-eluting stent was successfully placed using a STENT ONYX FRONTIER 3.5X12.   Post intervention, there is a 0% residual stenosis.   There is severe left ventricular systolic dysfunction.   LV end diastolic pressure is mildly elevated.   The left ventricular ejection fraction is less than 25% by visual estimate. 1.  Severe ostial left main stenosis with hazy appearance highly suggestive of acute plaque rupture.  No other obstructive disease. 2.  Severely reduced LV systolic function with an EF of 20% with akinesia of the mid to distal anterior, apical and infero-Apical myocardium. 3.  Successful angioplasty and drug-eluting stent placement to the ostial left main coronary artery. Recommendations: Dual antiplatelet therapy for at least 12 months. No beta-blockers for now due to bradycardia.   Monitor heart rate and blood pressure closely as the patient is at risk for decompensation given degree of myocardium at risk. Delays and door to device time related to language barrier and the difficulty of  obtaining an interpreter.    Cardiac Studies   Echocardiogram was done today and showed significant improvement in his LV systolic function with an EF of 40 to 45% with mild mitral regurgitation.  Patient Profile     62 y.o. male with no significant past medical history who presented with chest pain and syncope requiring 1 minute of bystander CPR with transient hypotension, intermittent high-grade AV block and evidence of anterior ST elevation on EKG  Assessment & Plan    1.  Anterior ST elevation myocardial infarction: This was due to plaque rupture in the ostial left main but fortunately the vessel had near normal flow.  No other obstructive disease.  The left main was treated successfully with PCI and drug-eluting stent placement. Brilinta is not covered by the patient's insurance and we will plan to transition to prasugrel 10 mg tomorrow with 30 mg loading dose. Continue aspirin 81 mg daily indefinitely.  2.  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  However, his echocardiogram today shows significant recovery with an EF of 40 to 45%.  No beta-blockers are recommended given persistent intermittent high-grade AV block.  Will consider adding a small dose ARB tomorrow.  His pulmonary pressure was normal by echo today and he does not require any diuresis.  3.  High-grade AV block: This is in the setting of a large anterior STEMI.  However, the vessel was not completely occluded and troponin elevation does not support large myocardial necrosis.  Thus, I am still hopeful that he might have recovery of his AV nodal function.  At the same time,  there is a good chance that he might require a permanent pacemaker if high-grade AV block persist beyond 48 hours. I will ask Dr. Caryl Comes to see the patient tomorrow for consultation.       For questions or updates, please contact [place] Please consult [email or web address] for contact info under        Signed, Kathlyn Sacramento, MD  08/06/2022, 1:45 PM

## 2022-08-06 NOTE — Progress Notes (Signed)
[facility] Progress Note  Patient: Craig Reid    [id number]  DOA: 08/05/2022     Date of Service: the patient was seen and examined on 08/06/2022  Chief Complaint  Patient presents with   Chest Pain   Brief hospital course:  Craig Reid is a 62 y.o. male who speaks [place], with no significant past medical history.  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 Patient reports 15-minute episode of anterior chest discomfort/pain associated with sensation of shortness of breath, sweating and marked fatigue/presyncope.  Patient lost conscious and apparently became pulseless, bystander did CPR at the site.  EMS arrived and patient regained consciousness.  Patient was complaining of chest pain.  Patient was brought into the [facility] ED around 9 PM.  EMS noted inferior ST elevation changes, patient was taken to the Cath Lab.  Cardiology reported that patient had a STEMI most likely due to plaque rupture ostial left main which was successfully treated with DES stent, LVEF 20% with akinesis noticed.  Patient was admitted in the ICU, 12 antiplatelet therapy was started.  [facility] hospitalist was consulted for admission and further management.      Assessment and Plan:  # Anterior ST elevation myocardial infarction, due to plaque rupture in the ostial left main but fortunately the vessel had near normal flow. No other obstructive disease.  Cardiology consulted, s/p successful PCI/DES placement ostial left main coronary artery.  S/p Brilinta 90 mg p.o. twice daily, it is not covered by patient's insurance so will be transition to prasugrel 10 mg tomorrow with 30 mg loading dose Continue aspirin 81 mg daily indefinitely.   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 Repeat TTE shows LVEF 40 to 45% No recommendation of beta-blockers due to persistent high-grade AV block.   Cardiology will consider adding ARB's tomorrow Pulmonary pressure was normal by echocardiogram.  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  If high-grade AV block persist beyond 48 hours then EP cardiology will be consulted. Continue to monitor on telemetry   # Syncope most likely due to acute MI Monitor vital signs Continue fall precautions We will ambulate patient before discharge  Hyperglycemia, known history of diabetes Continue NovoLog sliding scale Monitor CBG Check hemoglobin [id number]  Metabolic acidosis on admission most likely due to NSTEMI and syncope Acidosis resolved, bicarb within normal range  LFTs elevated could be secondary to syncopal episode and acute systolic CHF Continue to trend LFTs   Body mass index is 23.08 kg/m.  Interventions:    Diet: Heart healthy diet DVT Prophylaxis: Subcutaneous Lovenox   Advance goals of care discussion: Full code  Family Communication: family was present at bedside, at the time of interview.  The pt provided permission to discuss medical plan with the family. Opportunity was given to ask question and all questions were answered satisfactorily.   Disposition:  Pt is from Home, admitted with STEMI, still has high-grade AV block, which precludes a safe discharge. Discharge to home, when cleared by cardiology, may need 1-2 more days to improve..  Subjective: No significant overnight events, patient went into bradycardia heart rate was low up to 28 bpm.  Currently patient is having left-sided chest pain 3/10, no any other active issues.  Denies any shortness of breath.  No abdominal pain  Physical Exam: General: NAD, lying comfortably Appear in no distress, affect appropriate Eyes: PERRLA ENT: Oral Mucosa Clear, moist  Neck: no JVD,  Cardiovascular: S1 and S2 Present, no Murmur,   Respiratory: good respiratory effort, Bilateral Air entry equal and Decreased, no Crackles, no wheezes Abdomen: Bowel Sound present, Soft and no tenderness,  Skin: no rashes Extremities: no Pedal edema, no calf tenderness Neurologic: without any new focal findings Gait not checked due to patient safety concerns  Vitals:   08/06/22 1000 08/06/22 1100 08/06/22 1200 08/06/22 1300  BP: 114/68 108/63 116/69 114/67  Pulse: (!) 40 (!) 41 (!) 41 (!) 39  Resp: (!) '23 17 15 13  '$ Temp:      TempSrc:      SpO2: 100% 99% 100% 100%  Weight:      Height:        Intake/Output Summary (Last 24 hours) at 08/06/2022 1446 Last data filed at 08/06/2022 1300 Gross per 24 hour  Intake 823.67 ml  Output 1300 ml  Net -476.33 ml   Filed Weights   08/06/22 0500  Weight: 59.1 kg    Data Reviewed: I have personally reviewed and interpreted daily labs, tele strips, imagings as discussed above. I reviewed all nursing notes, pharmacy notes, vitals, pertinent old records I have discussed plan of care as described above with RN and patient/family.  CBC: Recent Labs  Lab 08/05/22 2145 08/06/22 0506  WBC 7.4 8.4  NEUTROABS 4.0  --   HGB 13.9 13.5  HCT 43.8 41.0  MCV 98.0 94.7  PLT 214 [id number]   Basic Metabolic Panel: Recent Labs  Lab 08/05/22 2145 08/06/22 0504 08/06/22 0506  NA 138  --  140  K 3.7  --  3.5  CL 107  --  109  CO2 19*  --  23  GLUCOSE 169*  --  107*  BUN 22*  --  17  CREATININE 1.12  --  0.75  CALCIUM 8.7*  --  9.1  MG  --  2.1  --   PHOS  --  3.2  --     Studies: ECHOCARDIOGRAM COMPLETE  Result Date: 08/06/2022    ECHOCARDIOGRAM REPORT   Patient Name:   Craig Reid Date of Exam: 08/06/2022 Medical Rec #:  [id number]            Height:       63.0 in Accession #:    [id number]           Weight:       130.3 lb Date of Birth:  12-Jun-1960             BSA:          1.612 m Patient Age:    45 years             BP:           114/68 mmHg Patient Gender: M                    HR:            125 bpm. Exam Location:  [facility] Procedure: 2D Echo, Cardiac Doppler and Color Doppler Indications:     Acute myocardial infarction I21.9  History:         Patient has no prior history of Echocardiogram examinations. No                  past medical history on file.  Sonographer:  Sherrie Sport Referring Phys:  [id number] [facility] A ARIDA Diagnosing Phys: Kathlyn Sacramento MD IMPRESSIONS  1. Left ventricular ejection fraction, by estimation, is 40 to 45%. The left ventricle has mildly decreased function. The left ventricle demonstrates global hypokinesis. Left ventricular diastolic parameters are indeterminate.  2. Right ventricular systolic function is normal. The right ventricular size is normal. There is normal pulmonary artery systolic pressure.  3. The mitral valve is normal in structure. Mild mitral valve regurgitation. No evidence of mitral stenosis.  4. The aortic valve is normal in structure. Aortic valve regurgitation is trivial. Aortic valve sclerosis is present, with no evidence of aortic valve stenosis. FINDINGS  Left Ventricle: Left ventricular ejection fraction, by estimation, is 40 to 45%. The left ventricle has mildly decreased function. The left ventricle demonstrates global hypokinesis. The left ventricular internal cavity size was normal in size. There is  borderline left ventricular hypertrophy. Left ventricular diastolic parameters are indeterminate. Right Ventricle: The right ventricular size is normal. No increase in right ventricular wall thickness. Right ventricular systolic function is normal. There is normal pulmonary artery systolic pressure. The tricuspid regurgitant velocity is 2.44 m/s, and  with an assumed right atrial pressure of 5 mmHg, the estimated right ventricular systolic pressure is 0000000 mmHg. Left Atrium: Left atrial size was normal in size. Right Atrium: Right atrial size was normal in size. Pericardium: There is no evidence of pericardial effusion. Mitral Valve: The mitral  valve is normal in structure. Mild mitral valve regurgitation. No evidence of mitral valve stenosis. Tricuspid Valve: The tricuspid valve is normal in structure. Tricuspid valve regurgitation is mild . No evidence of tricuspid stenosis. Aortic Valve: The aortic valve is normal in structure. Aortic valve regurgitation is trivial. Aortic valve sclerosis is present, with no evidence of aortic valve stenosis. Aortic valve mean gradient measures 2.0 mmHg. Aortic valve peak gradient measures 3.2 mmHg. Aortic valve area, by VTI measures 3.39 cm. Pulmonic Valve: The pulmonic valve was normal in structure. Pulmonic valve regurgitation is trivial. No evidence of pulmonic stenosis. Aorta: The aortic root is normal in size and structure. Venous: The inferior vena cava was not well visualized. IAS/Shunts: No atrial level shunt detected by color flow Doppler.  LEFT VENTRICLE PLAX 2D LVIDd:         4.50 cm LVIDs:         3.20 cm LV PW:         1.10 cm LV IVS:        1.10 cm LVOT diam:     2.10 cm LV SV:         48 LV SV Index:   30 LVOT Area:     3.46 cm  RIGHT VENTRICLE RV Basal diam:  4.60 cm RV Mid diam:    3.80 cm RV S prime:     13.10 cm/s TAPSE (M-mode): 2.8 cm LEFT ATRIUM             Index        RIGHT ATRIUM           Index LA diam:        2.70 cm 1.68 cm/m   RA Area:     17.00 cm LA Vol (A2C):   66.6 ml 41.32 ml/m  RA Volume:   49.40 ml  30.65 ml/m LA Vol (A4C):   28.5 ml 17.68 ml/m LA Biplane Vol: 42.2 ml 26.18 ml/m  AORTIC VALVE AV Area (Vmax):    2.55 cm AV Area (Vmean):  2.23 cm AV Area (VTI):     3.39 cm AV Vmax:           89.90 cm/s AV Vmean:          66.000 cm/s AV VTI:            0.141 m AV Peak Grad:      3.2 mmHg AV Mean Grad:      2.0 mmHg LVOT Vmax:         66.30 cm/s LVOT Vmean:        42.500 cm/s LVOT VTI:          0.138 m LVOT/AV VTI ratio: 0.98  AORTA Ao Root diam: 3.30 cm MITRAL VALVE               TRICUSPID VALVE MV Area (PHT): 4.73 cm    TR Peak grad:   23.8 mmHg MV Decel Time: 161 msec     TR Vmax:        244.00 cm/s MV E velocity: 73.90 cm/s MV A velocity: 32.50 cm/s  SHUNTS MV E/A ratio:  2.27        Systemic VTI:  0.14 m                            Systemic Diam: 2.10 cm Kathlyn Sacramento MD Electronically signed by Kathlyn Sacramento MD Signature Date/Time: 08/06/2022/1:37:33 PM    Final    Portable chest 1 View  Result Date: 08/06/2022 CLINICAL DATA:  Coronary artery disease. EXAM: PORTABLE CHEST 1 VIEW COMPARISON:  04/21/2019 FINDINGS: Single-view of the chest was obtained. Cardiac pads overlying the chest. Negative for a pneumothorax. No focal airspace disease and no overt pulmonary edema. Heart size is normal. The trachea is midline. Bony thorax is grossly intact. IMPRESSION: No focal chest disease. Electronically Signed   By: Markus Daft M.D.   On: 08/06/2022 07:47   US Venous Img Lower Bilateral (DVT)  Result Date: 08/06/2022 CLINICAL DATA:  62 year old male with lower extremity swelling. EXAM: BILATERAL LOWER EXTREMITY VENOUS DOPPLER ULTRASOUND TECHNIQUE: Gray-scale sonography with graded compression, as well as color Doppler and duplex ultrasound were performed to evaluate the lower extremity deep venous systems from the level of the common femoral vein and including the common femoral, femoral, profunda femoral, popliteal and calf veins including the posterior tibial, peroneal and gastrocnemius veins when visible. The superficial great saphenous vein was also interrogated. Spectral Doppler was utilized to evaluate flow at rest and with distal augmentation maneuvers in the common femoral, femoral and popliteal veins. COMPARISON:  None Available. FINDINGS: RIGHT LOWER EXTREMITY Common Femoral Vein: No evidence of thrombus. Normal compressibility, respiratory phasicity and response to augmentation. Saphenofemoral Junction: Limited visualization due to overlying bandage. Profunda Femoral Vein: No evidence of thrombus. Normal compressibility and flow on color Doppler imaging. Femoral Vein: No  evidence of thrombus. Normal compressibility, respiratory phasicity and response to augmentation. Popliteal Vein: No evidence of thrombus. Normal compressibility, respiratory phasicity and response to augmentation. Calf Veins: No evidence of thrombus. Normal compressibility and flow on color Doppler imaging. Other Findings:  None. LEFT LOWER EXTREMITY Common Femoral Vein: No evidence of thrombus. Normal compressibility, respiratory phasicity and response to augmentation. Saphenofemoral Junction: Limited visualization due to overlying bandage. Profunda Femoral Vein: No evidence of thrombus. Normal compressibility and flow on color Doppler imaging. Femoral Vein: No evidence of thrombus. Normal compressibility, respiratory phasicity and response to augmentation. Popliteal Vein: No evidence of thrombus. Normal compressibility,  respiratory phasicity and response to augmentation. Calf Veins: No evidence of thrombus. Normal compressibility and flow on color Doppler imaging. Other Findings:  None. IMPRESSION: No evidence of bilateral lower extremity deep venous thrombosis. Limited visualization of the bilateral saphenofemoral junctions due to overlying bandages. Ruthann Cancer, MD [facility] Electronically Signed   By: Ruthann Cancer M.D.   On: 08/06/2022 07:43   CARDIAC CATHETERIZATION  Result Date: 08/05/2022   Prox RCA lesion is 40% stenosed.   Ost LM to Mid LM lesion is 95% stenosed.   A drug-eluting stent was successfully placed using a STENT ONYX FRONTIER 3.5X12.   Post intervention, there is a 0% residual stenosis.   There is severe left ventricular systolic dysfunction.   LV end diastolic pressure is mildly elevated.   The left ventricular ejection fraction is less than 25% by visual estimate. 1.  Severe ostial left main stenosis with hazy appearance highly suggestive of acute plaque rupture.  No other obstructive disease. 2.  Severely reduced LV systolic  function with an EF of 20% with akinesia of the mid to distal anterior, apical and infero-Apical myocardium. 3.  Successful angioplasty and drug-eluting stent placement to the ostial left main coronary artery. Recommendations: Dual antiplatelet therapy for at least 12 months. No beta-blockers for now due to bradycardia.  Monitor heart rate and blood pressure closely as the patient is at risk for decompensation given degree of myocardium at risk. Delays and door to device time related to language barrier and the difficulty of obtaining an interpreter.    Scheduled Meds:  aspirin  81 mg Oral Daily   atorvastatin  80 mg Oral Daily   enoxaparin (LOVENOX) injection  40 mg Subcutaneous Q24H   insulin aspart  0-5 Units Subcutaneous QHS   insulin aspart  0-9 Units Subcutaneous TID WC   sodium chloride flush  3 mL Intravenous Q12H   sodium chloride flush  3 mL Intravenous Q12H   ticagrelor  90 mg Oral BID   Continuous Infusions:  sodium chloride 20 mL/hr at 08/06/22 1300   sodium chloride     dextrose 5 % and 0.45 % NaCl with KCl 10 mEq/L 75 mL/hr at 08/06/22 1300   PRN Meds: sodium chloride, acetaminophen **OR** acetaminophen, ondansetron (ZOFRAN) IV, sodium chloride flush  Time spent: 35 minutes  Author: Val Riles. MD [facility] 08/06/2022 2:46 PM  To reach On-call, see care teams to locate the attending and reach out to them via [email or web address]. If 7PM-7AM, please contact night-coverage If you still have difficulty reaching the attending provider, please page the [facility] (Director on Call) for [facility] on amion for assistance.

## 2022-08-06 NOTE — Progress Notes (Signed)
Notified MD concerning pts HR of 37, pt asymptomatic, no new orders given, will continue to monitor.

## 2022-08-06 NOTE — Discharge Instructions (Addendum)

## 2022-08-06 NOTE — Assessment & Plan Note (Signed)
Mild in the setting of receiving several fluids that are relatively acidotic.  At this time I will trend.

## 2022-08-06 NOTE — Assessment & Plan Note (Signed)
Given the findings of significant cardiac dysfunction in terms of LV function as well as bradycardia at this time as well as intra coronary angiography findings, I do suspect that this was precipitated by an acute coronary syndrome.  Will check lower extremity Dopplers to rule out occult VTE.  Maintain on telemetry at this time

## 2022-08-07 DIAGNOSIS — R55 Syncope and collapse: Secondary | ICD-10-CM

## 2022-08-07 DIAGNOSIS — I2101 ST elevation (STEMI) myocardial infarction involving left main coronary artery: Secondary | ICD-10-CM

## 2022-08-07 DIAGNOSIS — R001 Bradycardia, unspecified: Secondary | ICD-10-CM

## 2022-08-07 LAB — HEPATIC FUNCTION PANEL
ALT: 56 U/L — ABNORMAL HIGH (ref 0–44)
AST: 43 U/L — ABNORMAL HIGH (ref 15–41)
Albumin: 3.4 g/dL — ABNORMAL LOW (ref 3.5–5.0)
Alkaline Phosphatase: 61 U/L (ref 38–126)
Bilirubin, Direct: 0.2 mg/dL (ref 0.0–0.2)
Indirect Bilirubin: 1.2 mg/dL — ABNORMAL HIGH (ref 0.3–0.9)
Total Bilirubin: 1.4 mg/dL — ABNORMAL HIGH (ref 0.3–1.2)
Total Protein: 7.1 g/dL (ref 6.5–8.1)

## 2022-08-07 LAB — CBC
HCT: 43.2 % (ref 39.0–52.0)
Hemoglobin: 13.7 g/dL (ref 13.0–17.0)
MCH: 31 pg (ref 26.0–34.0)
MCHC: 31.7 g/dL (ref 30.0–36.0)
MCV: 97.7 fL (ref 80.0–100.0)
Platelets: 201 10*3/uL (ref 150–400)
RBC: 4.42 MIL/uL (ref 4.22–5.81)
RDW: 11.8 % (ref 11.5–15.5)
WBC: 6.6 10*3/uL (ref 4.0–10.5)
nRBC: 0 % (ref 0.0–0.2)

## 2022-08-07 LAB — BASIC METABOLIC PANEL
Anion gap: 4 — ABNORMAL LOW (ref 5–15)
BUN: 18 mg/dL (ref 6–20)
CO2: 22 mmol/L (ref 22–32)
Calcium: 8.8 mg/dL — ABNORMAL LOW (ref 8.9–10.3)
Chloride: 108 mmol/L (ref 98–111)
Creatinine, Ser: 0.91 mg/dL (ref 0.61–1.24)
GFR, Estimated: >60 mL/min (ref >60)
Glucose, Bld: 110 mg/dL — ABNORMAL HIGH (ref 70–99)
Potassium: 3.7 mmol/L (ref 3.5–5.1)
Sodium: 134 mmol/L — ABNORMAL LOW (ref 135–145)

## 2022-08-07 LAB — HEMOGLOBIN A1C
Hgb A1c MFr Bld: 5 % (ref 4.8–5.6)
Mean Plasma Glucose: 97 mg/dL

## 2022-08-07 LAB — PHOSPHORUS: Phosphorus: 3.1 mg/dL (ref 2.5–4.6)

## 2022-08-07 LAB — TSH: TSH: 2.107 u[IU]/mL (ref 0.350–4.500)

## 2022-08-07 LAB — MAGNESIUM: Magnesium: 2 mg/dL (ref 1.7–2.4)

## 2022-08-07 LAB — T4, FREE: Free T4: 0.88 ng/dL (ref 0.61–1.12)

## 2022-08-07 LAB — GLUCOSE, CAPILLARY
Glucose-Capillary: 103 mg/dL — ABNORMAL HIGH (ref 70–99)
Glucose-Capillary: 88 mg/dL (ref 70–99)
Glucose-Capillary: 97 mg/dL (ref 70–99)
Glucose-Capillary: 87 mg/dL (ref 70–99)

## 2022-08-07 MED ORDER — PRASUGREL HCL 10 MG PO TABS
10.0000 mg | ORAL_TABLET | Freq: Every day | ORAL | Status: DC
  Administered 2022-08-08 – 2022-08-10 (×3): 10 mg via ORAL
  Filled 2022-08-07 (×3): qty 1

## 2022-08-07 MED ORDER — PRASUGREL HCL 10 MG PO TABS
30.0000 mg | ORAL_TABLET | Freq: Once | ORAL | Status: AC
  Administered 2022-08-07: 30 mg via ORAL
  Filled 2022-08-07: qty 3

## 2022-08-07 NOTE — Evaluation (Addendum)
Occupational Therapy Evaluation Patient Details Name: Craig Reid MRN: [id number] DOB: 1961/04/12 Today's Date: 08/07/2022   History of Present Illness Patient is a 62 year old male with chest pain and syncope requiring 1 minute of bystander CPR. Found to have STEMI with placement of  PCI/DES.   Clinical Impression   Craig Reid was seen for OT evaluation this date, audio interpreter utilized (ID number [phone]). Prior to hospital admission, pt was IND including working at [employer]. Pt lives alone in apartment. Pt currently IND bed mobility and LBD. MOD I functional mobility, increased time only. Max HR 80 with mobility, reports nervousness with mobility citing stiffness. No skilled acute OT needs identified, will sign off. Upon hospital discharge, recommend no OT follow up.   Recommendations for follow up therapy are one component of a multi-disciplinary discharge planning process, led by the attending physician.  Recommendations may be updated based on patient status, additional functional criteria and insurance authorization.   Follow Up Recommendations  No OT follow up     Assistance Recommended at Discharge PRN  Patient can return home with the following Help with stairs or ramp for entrance    Functional Status Assessment  Patient has not had a recent decline in their functional status  Equipment Recommendations  None recommended by OT    Recommendations for Other Services       Precautions / Restrictions Precautions Precautions: Other (comment) Precaution Comments: monitor HR Restrictions Weight Bearing Restrictions: No      Mobility Bed Mobility Overal bed mobility: Independent                  Transfers Overall transfer level: Modified independent                 General transfer comment: increased time      Balance Overall balance assessment: No apparent balance deficits (not formally assessed)                                          ADL either performed or assessed with clinical judgement   ADL Overall ADL's : Independent                                              Pertinent Vitals/Pain Pain Assessment Pain Assessment: No/denies pain     Hand Dominance Right   Extremity/Trunk Assessment Upper Extremity Assessment Upper Extremity Assessment: Overall WFL for tasks assessed   Lower Extremity Assessment Lower Extremity Assessment: Overall WFL for tasks assessed       Communication Communication Communication: Interpreter utilized (ID number [id number])   Cognition Arousal/Alertness: Awake/alert Behavior During Therapy: WFL for tasks assessed/performed Overall Cognitive Status: Within Functional Limits for tasks assessed                                       General Comments  encouraged patient to perform routine walking with staff assistance while in the hospital            Home Living Family/patient expects to be discharged to:: Private residence Living Arrangements: Alone   Type of Home: Apartment Home Access: Level entry     Home Layout: One level  Home Equipment: None          Prior Functioning/Environment Prior Level of Function : Independent/Modified Independent;Working/employed             Mobility Comments: 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: Decreased activity tolerance         OT Goals(Current goals can be found in the care plan section) Acute Rehab OT Goals Patient Stated Goal: to go home OT Goal Formulation: With patient Time For Goal Achievement: 08/21/22 Potential to Achieve Goals: Good      Co-evaluation   Reason for Co-Treatment: To address functional/ADL transfers PT goals addressed during session: Mobility/safety with mobility        AM-PAC OT "6 Clicks" Daily Activity     Outcome Measure Help from another person eating meals?: None Help from  another person taking care of personal grooming?: None Help from another person toileting, which includes using toliet, bedpan, or urinal?: None Help from another person bathing (including washing, rinsing, drying)?: None Help from another person to put on and taking off regular upper body clothing?: None Help from another person to put on and taking off regular lower body clothing?: None 6 Click Score: 24   End of Session Nurse Communication: Mobility status  Activity Tolerance: Patient tolerated treatment well Patient left: in bed;with call bell/phone within reach  OT Visit Diagnosis: Unsteadiness on feet (R26.81)                Time: [id number] OT Time Calculation (min): 15 min Charges:  OT General Charges $OT Visit: 1 Visit OT Evaluation $OT Eval Low Complexity: 1 Low  Dessie Coma, M.S. OTR/L  08/07/22, 12:07 PM  ascom [phone]

## 2022-08-07 NOTE — Evaluation (Signed)
Physical Therapy Evaluation Patient Details Name: Craig Reid MRN: [id number] DOB: [birth year] Today's Date: 08/07/2022  History of Present Illness  Patient is a 62 year old male with chest pain and syncope requiring 1 minute of bystander CPR. Found to have STEMI with placement of  PCI/DES.  Clinical Impression  Patient is agreeable to PT. Interpreter ID [phone] used throughout session. Patient is independent at baseline and works as a Training and development officer. He reports feeling a little nervous during mobility today. He is modified independent (increased time) with all mobility with no physical assistance required. He ambulated in the hallway with vitals stable throughout, heart rate in the 70's. He reports no dizziness or pain with ambulation efforts. Encouraged patient to perform routine walking with staff assistance. No acute PT needs are identified at this time.       Recommendations for follow up therapy are one component of a multi-disciplinary discharge planning process, led by the attending physician.  Recommendations may be updated based on patient status, additional functional criteria and insurance authorization.  Follow Up Recommendations No PT follow up      Assistance Recommended at Discharge PRN  Patient can return home with the following  Assist for transportation    Equipment Recommendations None recommended by PT  Recommendations for Other Services       Functional Status Assessment Patient has not had a recent decline in their functional status     Precautions / Restrictions Precautions Precautions: Other (comment) Precaution Comments: monitor HR Restrictions Weight Bearing Restrictions: No      Mobility  Bed Mobility Overal bed mobility: Modified Independent                  Transfers Overall transfer level: Modified independent                      Ambulation/Gait Ambulation/Gait assistance: Modified independent (Device/Increase time) Gait  Distance (Feet): 100 Feet Assistive device: None Gait Pattern/deviations: Step-through pattern Gait velocity: decreased     General Gait Details: slow but steady gait with no assistance provided. heart rate in the 70's with activity with vitals monitored throughout. 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 Extremity Assessment Upper Extremity Assessment: Overall WFL for tasks assessed    Lower Extremity Assessment Lower Extremity Assessment: Overall WFL for tasks assessed       Communication   Communication: Interpreter utilized (ID number [phone])  Cognition Arousal/Alertness: Awake/alert Behavior During Therapy: WFL for tasks assessed/performed Overall  Cognitive Status: Within Functional Limits for tasks assessed                                          General Comments General comments (skin integrity, edema, etc.): encouraged patient to perform routine walking with staff assistance while in the hospital    Exercises     Assessment/Plan    PT Assessment Patient does not need any further PT services  PT Problem List         PT Treatment Interventions      PT Goals (Current goals can be found in the Care Plan section)  Acute Rehab PT Goals Patient Stated Goal: to  return to feeling normal PT Goal Formulation: All assessment and education complete, DC therapy    Frequency       Co-evaluation PT/OT/SLP Co-Evaluation/Treatment: Yes Reason for Co-Treatment: To address functional/ADL transfers PT goals addressed during session: Mobility/safety with mobility         AM-PAC PT "6 Clicks" Mobility  Outcome Measure Help needed turning from your back to your side while in a flat bed without using bedrails?: None Help needed moving from lying on your back to sitting on the side of a flat bed without using bedrails?: None Help needed moving to and from a bed to a chair (including a wheelchair)?: None Help needed standing up from a chair using your arms (e.g., wheelchair or bedside chair)?: None Help needed to walk in hospital room?: None Help needed climbing 3-5 steps with a railing? : None 6 Click Score: 24    End of Session   Activity Tolerance: Patient tolerated treatment well Patient left: in bed;with call bell/phone within reach Nurse Communication: Mobility status PT Visit Diagnosis: Muscle weakness (generalized) (M62.81)    Time: 1040-1057 PT Time Calculation (min) (ACUTE ONLY): 17 min   Charges:   PT Evaluation $PT Eval Low Complexity: 1 Low         Minna Merritts, PT, MPT   Percell Locus 08/07/2022, 11:30 AM

## 2022-08-07 NOTE — Consult Note (Signed)
Full note to follow  Assessment and Plan:  MI-anterior ST changes acute  ? LM plaque rupture >> DES   Pulseless syncope   High grade block intermittent    Cardiomyopathy  current EF 20% >> 40-45%   Patient with high-grade left main stenosis with evidence of bradycardia thereafter including high-grade heart block and 2: 1 conduction.  This is improved overnight and this morning he has intermittent 2: 1 with long cycle Wenckebach 6: 5, 4: 3.  Hopefully the conduction will recover and will not need pacing.  No prior history of syncope prior to this event.  We will follow with you.  ESC guidelines suggest monitoring for 5 days or as many as 10, so that if his conduction has not improved by Friday, this would be day 5/6 we could consider anticipating transferring him to [facility] for pacemaker implantation

## 2022-08-07 NOTE — Consult Note (Signed)
ELECTROPHYSIOLOGY CONSULT NOTE  Patient ID: Craig Reid, MRN: [id number], DOB/AGE: 1960-08-17 62 y.o. Admit date: 08/05/2022 Date of Consult: 08/07/2022  Primary Physician: [name], FNP Primary Cardiologist: MA Zadien Isensee is a 62 y.o. male who is being seen today for the evaluation of hart block at the request of MA.   Chief Complaint: hart block   HPI Craig Reid is a 62 y.o. male without prior history or syncope c/o chest pain and LOC  Bystander CPR>>EMS minor STE >>cath LM stenosis and DES; EF 20>>45% Over the subsequent 36 hrs conduction has gradually improved with now evidence of wenckebach physiology      History reviewed. No pertinent past medical history.     Surgical History:  Past Surgical History:  Procedure Laterality Date   CORONARY/GRAFT ACUTE MI REVASCULARIZATION N/A 08/05/2022   Procedure: Coronary/Graft Acute MI Revascularization;  Surgeon: Iran Ouch, MD;  Location: [facility] INVASIVE CV LAB;  Service: Cardiovascular;  Laterality: N/A;   LAPAROTOMY N/A 04/21/2019   Procedure: EXPLORATORY LAPAROTOMY, gastric ulcer repair;  Surgeon: Sung Amabile, DO;  Location: [facility] ORS;  Service: General;  Laterality: N/A;   LEFT HEART CATH AND CORONARY ANGIOGRAPHY N/A 08/05/2022   Procedure: LEFT HEART CATH AND CORONARY ANGIOGRAPHY;  Surgeon: Iran Ouch, MD;  Location: [facility] INVASIVE CV LAB;  Service: Cardiovascular;  Laterality: N/A;     Home Meds: Prior to Admission medications   Medication Sig Start Date End Date Taking? Authorizing Provider  HYDROcodone-acetaminophen (NORCO) 5-325 MG tablet Take 1 tablet by mouth every 6 (six) hours as needed for up to 6 doses for moderate pain. 04/27/19   Tonna Boehringer, Isami, DO  ibuprofen (ADVIL) 800 MG tablet Take 1 tablet (800 mg total) by mouth every 8 (eight) hours as needed for mild pain or moderate pain. 04/27/19   Sung Amabile, DO  Multiple Vitamin (MULTIVITAMIN WITH MINERALS) TABS tablet Take 1  tablet by mouth daily.    [provider]  pantoprazole (PROTONIX) 40 MG tablet Take 1 tablet (40 mg total) by mouth daily. 04/27/19 04/26/20  Sung Amabile, DO    Inpatient Medications:   aspirin  81 mg Oral Daily   atorvastatin  80 mg Oral Daily   Chlorhexidine Gluconate Cloth  6 each Topical Daily   enoxaparin (LOVENOX) injection  40 mg Subcutaneous Q24H   insulin aspart  0-5 Units Subcutaneous QHS   insulin aspart  0-9 Units Subcutaneous TID WC   sodium chloride flush  3 mL Intravenous Q12H   sodium chloride flush  3 mL Intravenous Q12H   ticagrelor  90 mg Oral BID     Allergies: No Known Allergies  Social History   Socioeconomic History   Marital status: Married    Spouse name: Not on file   Number of children: Not on file   Years of education: Not on file   Highest education level: Not on file  Occupational History   Not on file  Tobacco Use   Smoking status: Never   Smokeless tobacco: Never  Vaping Use   Vaping Use: Never used  Substance and Sexual Activity   Alcohol use: Never   Drug use: Not on file   Sexual activity: Not on file  Other Topics Concern   Not on file  Social History Narrative   Not on file   Social Determinants of Health   Financial Resource Strain: Not on file  Food Insecurity: No Food Insecurity (08/06/2022)   Hunger Vital Sign  Worried About Programme researcher, broadcasting/film/video in the Last Year: Never true    Ran Out of Food in the Last Year: Never true  Transportation Needs: No Transportation Needs (08/06/2022)   PRAPARE - Administrator, Civil Service (Medical): No    Lack of Transportation (Non-Medical): No  Physical Activity: Not on file  Stress: Not on file  Social Connections: Unknown (08/06/2022)   Social Connection and Isolation Panel [NHANES]    Frequency of Communication with Friends and Family: More than three times a week    Frequency of Social Gatherings with Friends and Family: More than three times a week    Attends  Religious Services: 1 to 4 times per year    Active Member of [employer] or Organizations: No    Attends Banker Meetings: Never    Marital Status: Patient refused  Intimate Partner Violence: Not At Risk (08/06/2022)   Humiliation, Afraid, Rape, and Kick questionnaire    Fear of Current or Ex-Partner: No    Emotionally Abused: No    Physically Abused: No    Sexually Abused: No     History reviewed. No pertinent family history.   ROS:  Please see the history of present illness.     All other systems reviewed and negative.    Physical Exam: Blood pressure 110/67, pulse (!) 34, temperature 98.6 F (37 C), temperature source Oral, resp. rate 16, height 5\' 3"  (1.6 m), weight 59.1 kg, SpO2 100 %. General: Well developed, well nourished male in no acute distress. Head: Normocephalic, atraumatic, sclera non-icteric, no xanthomas, nares are without discharge. EENT: normal Lymph Nodes:  none Back: without scoliosis/kyphosis, no CVA tendersness Neck: Negative for carotid bruits. JVD not elevated. Lungs: Clear bilaterally to auscultation without wheezes, rales, or rhonchi. Breathing is unlabored. Heart: RRR with S1 S2. No murmur , rubs, or gallops appreciated. Abdomen: Soft, non-tender, non-distended with normoactive bowel sounds. No hepatomegaly. No rebound/guarding. No obvious abdominal masses. Msk:  Strength and tone appear normal for age. Extremities: No clubbing or cyanosis. No  edema.  Distal pedal pulses are 2+ and equal bilaterally. Skin: Warm and Dry Neuro: Alert and oriented X 3. CN III-XII intact Grossly normal sensory and motor function . Psych:  Responds to questions appropriately with a normal affect.      Labs: Cardiac Enzymes No results for input(s): "CKTOTAL", "CKMB", "TROPONINI" in the last 72 hours. CBC Lab Results  Component Value Date   WBC 6.6 08/07/2022   HGB 13.7 08/07/2022   HCT 43.2 08/07/2022   MCV 97.7 08/07/2022   PLT 201 08/07/2022    PROTIME: Recent Labs    08/05/22 2145  LABPROT 13.6  INR 1.1   Chemistry  Recent Labs  Lab 08/07/22 0328  NA 134*  K 3.7  CL 108  CO2 22  BUN 18  CREATININE 0.91  CALCIUM 8.8*  PROT 7.1  BILITOT 1.4*  ALKPHOS 61  ALT 56*  AST 43*  GLUCOSE 110*   Lipids Lab Results  Component Value Date   CHOL 155 08/05/2022   HDL 58 08/05/2022   LDLCALC 81 08/05/2022   TRIG 78 08/05/2022   BNP No results found for: "PROBNP" Thyroid Function Tests: No results for input(s): "TSH", "T4TOTAL", "T3FREE", "THYROIDAB" in the last 72 hours.  Invalid input(s): "FREET3"         EKG:   3/4 0719 Sinus rhythm with first-degree AV block 400 msec cannot exclude 2: 1 block   Assessment and Plan:  MI-anterior ST changes acute  ? LM plaque rupture >> DES  Pulseless syncope  High grade block intermittent   Cardiomyopathy  current EF 20% >> 40-45%   See separate note  Sherryl Manges

## 2022-08-07 NOTE — Progress Notes (Signed)
Rounding Note    Patient Name: Craig Reid Date of Encounter: 08/07/2022  [place] Cardiologist: Kathlyn Sacramento, MD   Subjective   Phone interpretor used. He denies SOB. Has chest pain with movement. HR in the 30s with high grade heart blck. BP intermittently low. Kidney function normal.   Inpatient Medications    Scheduled Meds:  aspirin  81 mg Oral Daily   atorvastatin  80 mg Oral Daily   Chlorhexidine Gluconate Cloth  6 each Topical Daily   enoxaparin (LOVENOX) injection  40 mg Subcutaneous Q24H   insulin aspart  0-5 Units Subcutaneous QHS   insulin aspart  0-9 Units Subcutaneous TID WC   sodium chloride flush  3 mL Intravenous Q12H   sodium chloride flush  3 mL Intravenous Q12H   ticagrelor  90 mg Oral BID   Continuous Infusions:  sodium chloride 20 mL/hr at 08/06/22 1700   sodium chloride     dextrose 5 % and 0.45 % NaCl with KCl 10 mEq/L 75 mL/hr at 08/06/22 1700   PRN Meds: sodium chloride, acetaminophen **OR** acetaminophen, ondansetron (ZOFRAN) IV, sodium chloride flush   Vital Signs    Vitals:   08/07/22 0400 08/07/22 0500 08/07/22 0600 08/07/22 0700  BP: 118/68 120/78 107/66 110/67  Pulse: (!) 39 (!) 36 (!) 38 (!) 34  Resp: '17 16 15 16  '$ Temp: 98.6 F (37 C)     TempSrc: Oral     SpO2: 100% 100% 99% 100%  Weight:      Height:        Intake/Output Summary (Last 24 hours) at 08/07/2022 0817 Last data filed at 08/07/2022 0600 Gross per 24 hour  Intake 1600.3 ml  Output 1600 ml  Net 0.3 ml      08/06/2022    5:00 AM 04/21/2019    8:10 PM  Last 3 Weights  Weight (lbs) 130 lb 4.7 oz 110 lb  Weight (kg) 59.1 kg 49.896 kg      Telemetry    SB, HR 30s, high grade heart lock, second degree type 2 and CHB - Personally Reviewed  ECG    No new - Personally Reviewed  Physical Exam   GEN: No acute distress.   Neck: No JVD Cardiac: bradycardia, RR, no murmurs, rubs, or gallops.  Respiratory: Clear to auscultation  bilaterally. GI: Soft, nontender, non-distended  MS: No edema; No deformity. Neuro:  Nonfocal  Psych: Normal affect   Labs    High Sensitivity Troponin:   Recent Labs  Lab 08/05/22 2145 08/06/22 0012 08/06/22 0506  TROPONINIHS 15 389* 548*     Chemistry Recent Labs  Lab 08/05/22 2145 08/06/22 0504 08/06/22 0506 08/07/22 0328  NA 138  --  140 134*  K 3.7  --  3.5 3.7  CL 107  --  109 108  CO2 19*  --  23 22  GLUCOSE 169*  --  107* 110*  BUN 22*  --  17 18  CREATININE 1.12  --  0.75 0.91  CALCIUM 8.7*  --  9.1 8.8*  MG  --  2.1  --  2.0  PROT 7.4  --  7.6 7.1  ALBUMIN 3.6  --  3.8 3.4*  AST 108*  --  79* 43*  ALT 68*  --  76* 56*  ALKPHOS 69  --  69 61  BILITOT 0.8  --  1.2 1.4*  GFRNONAA >60  --  >60 >60  ANIONGAP 12  --  8 4*  Lipids  Recent Labs  Lab 08/05/22 2145  CHOL 155  TRIG 78  HDL 58  LDLCALC 81  CHOLHDL 2.7    Hematology Recent Labs  Lab 08/05/22 2145 08/06/22 0506 08/07/22 0328  WBC 7.4 8.4 6.6  RBC 4.47 4.33 4.42  HGB 13.9 13.5 13.7  HCT 43.8 41.0 43.2  MCV 98.0 94.7 97.7  MCH 31.1 31.2 31.0  MCHC 31.7 32.9 31.7  RDW 11.4* 11.4* 11.8  PLT 214 208 201   Thyroid No results for input(s): "TSH", "FREET4" in the last 168 hours.  BNP Recent Labs  Lab 08/06/22 0504  BNP 191.1*    DDimer No results for input(s): "DDIMER" in the last 168 hours.   Radiology    ECHOCARDIOGRAM COMPLETE  Result Date: 08/06/2022    ECHOCARDIOGRAM REPORT   Patient Name:   Craig Reid Date of Exam: 08/06/2022 Medical Rec #:  [id number]            Height:       63.0 in Accession #:    [id number]           Weight:       130.3 lb Date of Birth:  03/19/1961             BSA:          1.612 m Patient Age:    47 years             BP:           114/68 mmHg Patient Gender: M                    HR:           125 bpm. Exam Location:  [facility] Procedure: 2D Echo, Cardiac Doppler and Color Doppler Indications:     Acute myocardial infarction I21.9  History:          Patient has no prior history of Echocardiogram examinations. No                  past medical history on file.  Sonographer:     Sherrie Sport Referring Phys:  [id number] A ARIDA Diagnosing Phys: Kathlyn Sacramento MD IMPRESSIONS  1. Left ventricular ejection fraction, by estimation, is 40 to 45%. The left ventricle has mildly decreased function. The left ventricle demonstrates global hypokinesis. Left ventricular diastolic parameters are indeterminate.  2. Right ventricular systolic function is normal. The right ventricular size is normal. There is normal pulmonary artery systolic pressure.  3. The mitral valve is normal in structure. Mild mitral valve regurgitation. No evidence of mitral stenosis.  4. The aortic valve is normal in structure. Aortic valve regurgitation is trivial. Aortic valve sclerosis is present, with no evidence of aortic valve stenosis. FINDINGS  Left Ventricle: Left ventricular ejection fraction, by estimation, is 40 to 45%. The left ventricle has mildly decreased function. The left ventricle demonstrates global hypokinesis. The left ventricular internal cavity size was normal in size. There is  borderline left ventricular hypertrophy. Left ventricular diastolic parameters are indeterminate. Right Ventricle: The right ventricular size is normal. No increase in right ventricular wall thickness. Right ventricular systolic function is normal. There is normal pulmonary artery systolic pressure. The tricuspid regurgitant velocity is 2.44 m/s, and  with an assumed right atrial pressure of 5 mmHg, the estimated right ventricular systolic pressure is 0000000 mmHg. Left Atrium: Left atrial size was normal in size. Right Atrium: Right atrial size was normal in size. Pericardium:  There is no evidence of pericardial effusion. Mitral Valve: The mitral valve is normal in structure. Mild mitral valve regurgitation. No evidence of mitral valve stenosis. Tricuspid Valve: The tricuspid valve is normal in structure.  Tricuspid valve regurgitation is mild . No evidence of tricuspid stenosis. Aortic Valve: The aortic valve is normal in structure. Aortic valve regurgitation is trivial. Aortic valve sclerosis is present, with no evidence of aortic valve stenosis. Aortic valve mean gradient measures 2.0 mmHg. Aortic valve peak gradient measures 3.2 mmHg. Aortic valve area, by VTI measures 3.39 cm. Pulmonic Valve: The pulmonic valve was normal in structure. Pulmonic valve regurgitation is trivial. No evidence of pulmonic stenosis. Aorta: The aortic root is normal in size and structure. Venous: The inferior vena cava was not well visualized. IAS/Shunts: No atrial level shunt detected by color flow Doppler.  LEFT VENTRICLE PLAX 2D LVIDd:         4.50 cm LVIDs:         3.20 cm LV PW:         1.10 cm LV IVS:        1.10 cm LVOT diam:     2.10 cm LV SV:         48 LV SV Index:   30 LVOT Area:     3.46 cm  RIGHT VENTRICLE RV Basal diam:  4.60 cm RV Mid diam:    3.80 cm RV S prime:     13.10 cm/s TAPSE (M-mode): 2.8 cm LEFT ATRIUM             Index        RIGHT ATRIUM           Index LA diam:        2.70 cm 1.68 cm/m   RA Area:     17.00 cm LA Vol (A2C):   66.6 ml 41.32 ml/m  RA Volume:   49.40 ml  30.65 ml/m LA Vol (A4C):   28.5 ml 17.68 ml/m LA Biplane Vol: 42.2 ml 26.18 ml/m  AORTIC VALVE AV Area (Vmax):    2.55 cm AV Area (Vmean):   2.23 cm AV Area (VTI):     3.39 cm AV Vmax:           89.90 cm/s AV Vmean:          66.000 cm/s AV VTI:            0.141 m AV Peak Grad:      3.2 mmHg AV Mean Grad:      2.0 mmHg LVOT Vmax:         66.30 cm/s LVOT Vmean:        42.500 cm/s LVOT VTI:          0.138 m LVOT/AV VTI ratio: 0.98  AORTA Ao Root diam: 3.30 cm MITRAL VALVE               TRICUSPID VALVE MV Area (PHT): 4.73 cm    TR Peak grad:   23.8 mmHg MV Decel Time: 161 msec    TR Vmax:        244.00 cm/s MV E velocity: 73.90 cm/s MV A velocity: 32.50 cm/s  SHUNTS MV E/A ratio:  2.27        Systemic VTI:  0.14 m                             Systemic Diam: 2.10 cm Kathlyn Sacramento  MD Electronically signed by Kathlyn Sacramento MD Signature Date/Time: 08/06/2022/1:37:33 PM    Final    Portable chest 1 View  Result Date: 08/06/2022 CLINICAL DATA:  Coronary artery disease. EXAM: PORTABLE CHEST 1 VIEW COMPARISON:  04/21/2019 FINDINGS: Single-view of the chest was obtained. Cardiac pads overlying the chest. Negative for a pneumothorax. No focal airspace disease and no overt pulmonary edema. Heart size is normal. The trachea is midline. Bony thorax is grossly intact. IMPRESSION: No focal chest disease. Electronically Signed   By: Markus Daft M.D.   On: 08/06/2022 07:47   US Venous Img Lower Bilateral (DVT)  Result Date: 08/06/2022 CLINICAL DATA:  62 year old male with lower extremity swelling. EXAM: BILATERAL LOWER EXTREMITY VENOUS DOPPLER ULTRASOUND TECHNIQUE: Gray-scale sonography with graded compression, as well as color Doppler and duplex ultrasound were performed to evaluate the lower extremity deep venous systems from the level of the common femoral vein and including the common femoral, femoral, profunda femoral, popliteal and calf veins including the posterior tibial, peroneal and gastrocnemius veins when visible. The superficial great saphenous vein was also interrogated. Spectral Doppler was utilized to evaluate flow at rest and with distal augmentation maneuvers in the common femoral, femoral and popliteal veins. COMPARISON:  None Available. FINDINGS: RIGHT LOWER EXTREMITY Common Femoral Vein: No evidence of thrombus. Normal compressibility, respiratory phasicity and response to augmentation. Saphenofemoral Junction: Limited visualization due to overlying bandage. Profunda Femoral Vein: No evidence of thrombus. Normal compressibility and flow on color Doppler imaging. Femoral Vein: No evidence of thrombus. Normal compressibility, respiratory phasicity and response to augmentation. Popliteal Vein: No evidence of thrombus. Normal compressibility,  respiratory phasicity and response to augmentation. Calf Veins: No evidence of thrombus. Normal compressibility and flow on color Doppler imaging. Other Findings:  None. LEFT LOWER EXTREMITY Common Femoral Vein: No evidence of thrombus. Normal compressibility, respiratory phasicity and response to augmentation. Saphenofemoral Junction: Limited visualization due to overlying bandage. Profunda Femoral Vein: No evidence of thrombus. Normal compressibility and flow on color Doppler imaging. Femoral Vein: No evidence of thrombus. Normal compressibility, respiratory phasicity and response to augmentation. Popliteal Vein: No evidence of thrombus. Normal compressibility, respiratory phasicity and response to augmentation. Calf Veins: No evidence of thrombus. Normal compressibility and flow on color Doppler imaging. Other Findings:  None. IMPRESSION: No evidence of bilateral lower extremity deep venous thrombosis. Limited visualization of the bilateral saphenofemoral junctions due to overlying bandages. Ruthann Cancer, MD [facility] Radiology Electronically Signed   By: Ruthann Cancer M.D.   On: 08/06/2022 07:43   CARDIAC CATHETERIZATION  Result Date: 08/05/2022   Prox RCA lesion is 40% stenosed.   Ost LM to Mid LM lesion is 95% stenosed.   A drug-eluting stent was successfully placed using a STENT ONYX FRONTIER 3.5X12.   Post intervention, there is a 0% residual stenosis.   There is severe left ventricular systolic dysfunction.   LV end diastolic pressure is mildly elevated.   The left ventricular ejection fraction is less than 25% by visual estimate. 1.  Severe ostial left main stenosis with hazy appearance highly suggestive of acute plaque rupture.  No other obstructive disease. 2.  Severely reduced LV systolic function with an EF of 20% with akinesia of the mid to distal anterior, apical and infero-Apical myocardium. 3.  Successful angioplasty and drug-eluting stent  placement to the ostial left main coronary artery. Recommendations: Dual antiplatelet therapy for at least 12 months. No beta-blockers for now due to bradycardia.  Monitor heart rate and blood  pressure closely as the patient is at risk for decompensation given degree of myocardium at risk. Delays and door to device time related to language barrier and the difficulty of obtaining an interpreter.    Cardiac Studies   Echo 08/06/22  1. Left ventricular ejection fraction, by estimation, is 40 to 45%. The  left ventricle has mildly decreased function. The left ventricle  demonstrates global hypokinesis. Left ventricular diastolic parameters are  indeterminate.   2. Right ventricular systolic function is normal. The right ventricular  size is normal. There is normal pulmonary artery systolic pressure.   3. The mitral valve is normal in structure. Mild mitral valve  regurgitation. No evidence of mitral stenosis.   4. The aortic valve is normal in structure. Aortic valve regurgitation is  trivial. Aortic valve sclerosis is present, with no evidence of aortic  valve stenosis.   Cardiac cath 08/05/22    Prox RCA lesion is 40% stenosed.   Ost LM to Mid LM lesion is 95% stenosed.   A drug-eluting stent was successfully placed using a STENT ONYX FRONTIER 3.5X12.   Post intervention, there is a 0% residual stenosis.   There is severe left ventricular systolic dysfunction.   LV end diastolic pressure is mildly elevated.   The left ventricular ejection fraction is less than 25% by visual estimate.   1.  Severe ostial left main stenosis with hazy appearance highly suggestive of acute plaque rupture.  No other obstructive disease. 2.  Severely reduced LV systolic function with an EF of 20% with akinesia of the mid to distal anterior, apical and infero-Apical myocardium. 3.  Successful angioplasty and drug-eluting stent placement to the ostial left main coronary artery.   Recommendations: Dual antiplatelet  therapy for at least 12 months. No beta-blockers for now due to bradycardia.  Monitor heart rate and blood pressure closely as the patient is at risk for decompensation given degree of myocardium at risk. Delays and door to device time related to language barrier and the difficulty of obtaining an interpreter.    Patient Profile     61 y.o. male  with no significant past medical history who presented with chest pain and syncope requiring 1 minute of bystander CPR with transient hypotension, intermittent high-grade AV block and evidence of anterior ST elevation on EKG.  Assessment & Plan    Anterior STEMI - presented 3/3 with Anterior STEMI from plaqe rupture in the ostial left main treated with PCI/DES placement.  - started on DAPT with ASA and Brilinta, however Brilinta is not covered on his insurance and Brilinta was switched to Effient  - Effient '30mg'$  today, followed by '10mg'$  daily thereafter. - continue Lipitor - No BB given high grade AV block - echo showed reduced LVEF 40-45% - patient reports chest discomfort, may be MSK given CPR  Acute systolic CHF ICM - at cath EF was 20%. 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 Intermittent CHB - in the setting of Acute MI  Elevated LFTs - improving  HLD - LDL 81 - continue Lipitor '80mg'$  daily   For questions or updates, please contact [place] Please consult [email or web address] for contact info under        Signed, Ayani Ospina  Ninfa Meeker, PA-C  08/07/2022, 8:17 AM

## 2022-08-07 NOTE — Progress Notes (Signed)
[facility] Progress Note  Patient: Craig Reid    [id number]  DOA: 08/05/2022     Date of Service: the patient was seen and examined on 08/07/2022  Chief Complaint  Patient presents with   Chest Pain   Brief hospital course:  Linzie Brunsvold is a 62 y.o. male who speaks [place], with no significant past medical history.  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 Patient reports 15-minute episode of anterior chest discomfort/pain associated with sensation of shortness of breath, sweating and marked fatigue/presyncope.  Patient lost conscious and apparently became pulseless, bystander did CPR at the site.  EMS arrived and patient regained consciousness.  Patient was complaining of chest pain.  Patient was brought into the [facility] ED around 9 PM.  EMS noted inferior ST elevation changes, patient was taken to the Cath Lab.  Cardiology reported that patient had a STEMI most likely due to plaque rupture ostial left main which was successfully treated with DES stent, LVEF 20% with akinesis noticed.  Patient was admitted in the ICU, 12 antiplatelet therapy was started.  [facility] hospitalist was consulted for admission and further management.      Assessment and Plan:  # Anterior ST elevation myocardial infarction, due to plaque rupture in the ostial left main but fortunately the vessel had near normal flow. No other obstructive disease.  Cardiology consulted, s/p successful PCI/DES placement ostial left main coronary artery.  S/p Brilinta 90 mg p.o. twice daily, it is not covered by patient's insurance so started prasugrel 10 mg pod and 30 mg loading dose given on 3/5 Continue aspirin 81 mg daily indefinitely.   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 Repeat TTE shows LVEF 40 to 45% No recommendation of beta-blockers due to persistent high-grade AV block.  Cardiology will  consider adding ARB's once blood pressure is stable. Pulmonary pressure was normal by echocardiogram.  Does not need any diuresis at this time Patient will need repeat 2D echocardiogram up to 2 months  # High-grade AV block due to anterior STEMI As per cardiologist, likely AV nodal function will recover, and he might not require permanent pacemaker.  If high-grade AV block persist beyond 48 hours then EP cardiology will be consulted. Continue to monitor on telemetry   # Syncope most likely due to acute MI Monitor vital signs Continue fall precautions We will ambulate patient before discharge  Hyperglycemia, could be stress-induced HbA1c 5.0, nondiabetic range. S/p NovoLog sliding scale, which has been discontinued.   Metabolic acidosis on admission most likely due to NSTEMI and syncope Acidosis resolved, bicarb within normal range  LFTs elevated could be secondary to syncopal episode and acute systolic CHF LFTs are trending down   Body mass index is 23.08 kg/m.  Interventions:    Diet: Heart healthy diet DVT Prophylaxis: Subcutaneous Lovenox   Advance goals of care discussion: Full code  Family Communication: family was present at bedside, at the time of interview.  The pt provided permission to discuss medical plan with the family. Opportunity was given to ask question and all questions were answered satisfactorily.   Disposition:  Pt is from Home, admitted with STEMI, still has high-grade AV block, which precludes a safe discharge. Discharge to home, when cleared by cardiology, may need 1-2 more days to improve..  Subjective: No significant overnight events, patient still complaining of development of chest pain could be secondary to CPR.  Resting comfortably,  denies any other active issues.  Physical Exam: General: NAD, lying comfortably Appear in no distress, affect appropriate Eyes: PERRLA ENT: Oral Mucosa Clear, moist  Neck: no JVD,  Cardiovascular: S1 and S2  Present, no Murmur,  Respiratory: good respiratory effort, Bilateral Air entry equal and Decreased, no Crackles, no wheezes Abdomen: Bowel Sound present, Soft and no tenderness,  Skin: no rashes Extremities: no Pedal edema, no calf tenderness Neurologic: without any new focal findings Gait not checked due to patient safety concerns  Vitals:   08/07/22 1000 08/07/22 1200 08/07/22 1300 08/07/22 1400  BP: 126/64 119/69 124/77 118/80  Pulse: (!) 38 67 (!) 50 67  Resp: '20 20 20 18  '$ Temp:  97.8 F (36.6 C)    TempSrc:  Oral    SpO2: 100% 97% 98% 98%  Weight:      Height:        Intake/Output Summary (Last 24 hours) at 08/07/2022 1450 Last data filed at 08/07/2022 1200 Gross per 24 hour  Intake 2007.63 ml  Output 2250 ml  Net -242.37 ml   Filed Weights   08/06/22 0500  Weight: 59.1 kg    Data Reviewed: I have personally reviewed and interpreted daily labs, tele strips, imagings as discussed above. I reviewed all nursing notes, pharmacy notes, vitals, pertinent old records I have discussed plan of care as described above with RN and patient/family.  CBC: Recent Labs  Lab 08/05/22 2145 08/06/22 0506 08/07/22 0328  WBC 7.4 8.4 6.6  NEUTROABS 4.0  --   --   HGB 13.9 13.5 13.7  HCT 43.8 41.0 43.2  MCV 98.0 94.7 97.7  PLT 214 208 [id number]   Basic Metabolic Panel: Recent Labs  Lab 08/05/22 2145 08/06/22 0504 08/06/22 0506 08/07/22 0328  NA 138  --  140 134*  K 3.7  --  3.5 3.7  CL 107  --  109 108  CO2 19*  --  23 22  GLUCOSE 169*  --  107* 110*  BUN 22*  --  17 18  CREATININE 1.12  --  0.75 0.91  CALCIUM 8.7*  --  9.1 8.8*  MG  --  2.1  --  2.0  PHOS  --  3.2  --  3.1    Studies: No results found.  Scheduled Meds:  aspirin  81 mg Oral Daily   atorvastatin  80 mg Oral Daily   Chlorhexidine Gluconate Cloth  6 each Topical Daily   enoxaparin (LOVENOX) injection  40 mg Subcutaneous Q24H   [START ON 08/08/2022] prasugrel  10 mg Oral Daily   sodium chloride flush  3 mL  Intravenous Q12H   sodium chloride flush  3 mL Intravenous Q12H   Continuous Infusions:  sodium chloride 20 mL/hr at 08/07/22 0952   sodium chloride     dextrose 5 % and 0.45 % NaCl with KCl 10 mEq/L Stopped (08/07/22 0930)   PRN Meds: sodium chloride, acetaminophen **OR** acetaminophen, ondansetron (ZOFRAN) IV, sodium chloride flush  Time spent: 35 minutes  Author: Val Riles. MD [facility] 08/07/2022 2:50 PM  To reach On-call, see care teams to locate the attending and reach out to them via [email or web address]. If 7PM-7AM, please contact night-coverage If you still have difficulty reaching the attending provider, please page the [facility] (Director on Call) for [facility] on amion for assistance.

## 2022-08-08 DIAGNOSIS — I441 Atrioventricular block, second degree: Secondary | ICD-10-CM

## 2022-08-08 DIAGNOSIS — I429 Cardiomyopathy, unspecified: Secondary | ICD-10-CM

## 2022-08-08 DIAGNOSIS — I442 Atrioventricular block, complete: Secondary | ICD-10-CM

## 2022-08-08 LAB — BASIC METABOLIC PANEL
Anion gap: 9 (ref 5–15)
BUN: 21 mg/dL — ABNORMAL HIGH (ref 6–20)
CO2: 22 mmol/L (ref 22–32)
Calcium: 9.1 mg/dL (ref 8.9–10.3)
Chloride: 106 mmol/L (ref 98–111)
Creatinine, Ser: 0.87 mg/dL (ref 0.61–1.24)
GFR, Estimated: >60 mL/min (ref >60)
Glucose, Bld: 92 mg/dL (ref 70–99)
Potassium: 3.4 mmol/L — ABNORMAL LOW (ref 3.5–5.1)
Sodium: 137 mmol/L (ref 135–145)

## 2022-08-08 LAB — CBC
HCT: 45.6 % (ref 39.0–52.0)
Hemoglobin: 15.1 g/dL (ref 13.0–17.0)
MCH: 31.7 pg (ref 26.0–34.0)
MCHC: 33.1 g/dL (ref 30.0–36.0)
MCV: 95.6 fL (ref 80.0–100.0)
Platelets: 224 10*3/uL (ref 150–400)
RBC: 4.77 MIL/uL (ref 4.22–5.81)
RDW: 11.5 % (ref 11.5–15.5)
WBC: 8.6 10*3/uL (ref 4.0–10.5)
nRBC: 0 % (ref 0.0–0.2)

## 2022-08-08 LAB — LIPOPROTEIN A (LPA): Lipoprotein (a): 99.8 nmol/L — ABNORMAL HIGH (ref <75.0)

## 2022-08-08 LAB — GLUCOSE, CAPILLARY
Glucose-Capillary: 81 mg/dL (ref 70–99)
Glucose-Capillary: 120 mg/dL — ABNORMAL HIGH (ref 70–99)
Glucose-Capillary: 95 mg/dL (ref 70–99)
Glucose-Capillary: 91 mg/dL (ref 70–99)

## 2022-08-08 LAB — MAGNESIUM: Magnesium: 2.1 mg/dL (ref 1.7–2.4)

## 2022-08-08 LAB — PHOSPHORUS: Phosphorus: 3.7 mg/dL (ref 2.5–4.6)

## 2022-08-08 MED ORDER — LOSARTAN POTASSIUM 25 MG PO TABS
12.5000 mg | ORAL_TABLET | Freq: Every day | ORAL | Status: DC
  Administered 2022-08-08 – 2022-08-09 (×2): 12.5 mg via ORAL
  Filled 2022-08-08 (×2): qty 1

## 2022-08-08 MED ORDER — POTASSIUM CHLORIDE CRYS ER 20 MEQ PO TBCR
40.0000 meq | EXTENDED_RELEASE_TABLET | Freq: Once | ORAL | Status: AC
  Administered 2022-08-08: 40 meq via ORAL
  Filled 2022-08-08: qty 2

## 2022-08-08 NOTE — Consult Note (Addendum)
   Heart Failure Nurse Navigator Note   HFrEF 40 to 45%.  Left ventricle with global hypokinesis.  Left ventricular diastolic parameters are indeterminate.  Right ventricular systolic function is normal.  Mild mitral regurgitation.  At time of cath ejection fraction was noted to be 20%.   He presented to the emergency room for episode of loss of consciousness was apparently pulseless and underwent bystander CPR.  When EMS arrived patient had regained consciousness and was complaining of chest pain.  KG showed inferior ST changes and was taken to the cardiac catheterization lab.  He received a drug-eluting stent.  Comorbidities:  Recent anterior ST elevation MI High-grade AV block   Medications:  Aspirin 81 mg daily Atorvastatin 80 mg daily Effient 10 mg daily  On beta-blockers due to high-grade block Considering adding ARB  Labs:  Sodium 137, potassium 3.4, chloride 106, CO2 22, BUN 21, creatinine 0.87, estimated GFR 60, magnesium 2.1, hemoglobin 15.1, hematocrit 45.6, phosphorus 3.7 Weight not documented Intake 977 mL Output 1000 mL  Initial meeting with patient, he had been up walking around his room but at time of interview and had gotten back into bed, was in no acute distress.  He was able to get his cousin," Gid" on his phone.  I introduced myself and explained my role in taking care of his cousin.  He voices understanding.  Went over heart failure and what it means.  Also discussed the importance of sodium restriction, fluid restriction, daily weights and reporting changes in signs and symptoms to his doctors.  Made aware that they have a follow-up appointment in the outpatient heart failure clinic and asked that a family member come with him to interpret.  I also made him aware that I was giving him the living with heart failure teaching booklet, zone magnet, info on heart failure and low-sodium, weight chart that is in [place] and asked that they would interpret it  for the patient.  He voices understanding.  He had no further questions.  He has follow-up in the outpatient heart failure clinic on March 15 at [place]

## 2022-08-08 NOTE — Progress Notes (Signed)
Rounding Note    Patient Name: Craig Reid Date of Encounter: 08/08/2022  [place] Cardiologist: Kathlyn Sacramento, MD   Subjective   No acute events overnight, denies chest pain, denies dizziness.  Cardiac monitor shows Mobitz 1 AV block.  Inpatient Medications    Scheduled Meds:  aspirin  81 mg Oral Daily   atorvastatin  80 mg Oral Daily   Chlorhexidine Gluconate Cloth  6 each Topical Daily   enoxaparin (LOVENOX) injection  40 mg Subcutaneous Q24H   prasugrel  10 mg Oral Daily   sodium chloride flush  3 mL Intravenous Q12H   sodium chloride flush  3 mL Intravenous Q12H   Continuous Infusions:  sodium chloride     PRN Meds: sodium chloride, acetaminophen **OR** acetaminophen, ondansetron (ZOFRAN) IV, sodium chloride flush   Vital Signs    Vitals:   08/07/22 2223 08/07/22 2347 08/08/22 0359 08/08/22 0900  BP: 133/75 122/73 125/83 131/85  Pulse: (!) 51 (!) 50 67 68  Resp: '18 20 20 20  '$ Temp: 97.8 F (36.6 C) 98.2 F (36.8 C) (!) 97.4 F (36.3 C) 97.8 F (36.6 C)  TempSrc:    Oral  SpO2: 100% 99% 98% 100%  Weight:      Height:        Intake/Output Summary (Last 24 hours) at 08/08/2022 1115 Last data filed at 08/07/2022 1912 Gross per 24 hour  Intake 210 ml  Output 450 ml  Net -240 ml      08/06/2022    5:00 AM 04/21/2019    8:10 PM  Last 3 Weights  Weight (lbs) 130 lb 4.7 oz 110 lb  Weight (kg) 59.1 kg 49.896 kg      Telemetry    Mobitz 1 AV block.- Personally Reviewed  ECG     - Personally Reviewed  Physical Exam   GEN: No acute distress.   Neck: No JVD Cardiac: RRR, no murmurs, rubs, or gallops.  Respiratory: Clear to auscultation bilaterally. GI: Soft, nontender, non-distended  MS: No edema; No deformity. Neuro:  Nonfocal  Psych: Normal affect   Labs    High Sensitivity Troponin:   Recent Labs  Lab 08/05/22 2145 08/06/22 0012 08/06/22 0506  TROPONINIHS 15 389* 548*     Chemistry Recent Labs  Lab  08/05/22 2145 08/06/22 0504 08/06/22 0506 08/07/22 0328 08/08/22 0231  NA 138  --  140 134* 137  K 3.7  --  3.5 3.7 3.4*  CL 107  --  109 108 106  CO2 19*  --  '23 22 22  '$ GLUCOSE 169*  --  107* 110* 92  BUN 22*  --  17 18 21*  CREATININE 1.12  --  0.75 0.91 0.87  CALCIUM 8.7*  --  9.1 8.8* 9.1  MG  --  2.1  --  2.0 2.1  PROT 7.4  --  7.6 7.1  --   ALBUMIN 3.6  --  3.8 3.4*  --   AST 108*  --  79* 43*  --   ALT 68*  --  76* 56*  --   ALKPHOS 69  --  69 61  --   BILITOT 0.8  --  1.2 1.4*  --   GFRNONAA >60  --  >60 >60 >60  ANIONGAP 12  --  8 4* 9    Lipids  Recent Labs  Lab 08/05/22 2145  CHOL 155  TRIG 78  HDL 58  LDLCALC 81  CHOLHDL 2.7  Hematology Recent Labs  Lab 08/06/22 0506 08/07/22 0328 08/08/22 0231  WBC 8.4 6.6 8.6  RBC 4.33 4.42 4.77  HGB 13.5 13.7 15.1  HCT 41.0 43.2 45.6  MCV 94.7 97.7 95.6  MCH 31.2 31.0 31.7  MCHC 32.9 31.7 33.1  RDW 11.4* 11.8 11.5  PLT 208 201 224   Thyroid  Recent Labs  Lab 08/07/22 1750  TSH 2.107  FREET4 0.88    BNP Recent Labs  Lab 08/06/22 0504  BNP 191.1*    DDimer No results for input(s): "DDIMER" in the last 168 hours.   Radiology    No results found.  Cardiac Studies   TTE 08/2022 1. Left ventricular ejection fraction, by estimation, is 40 to 45%. The  left ventricle has mildly decreased function. The left ventricle  demonstrates global hypokinesis. Left ventricular diastolic parameters are  indeterminate.   2. Right ventricular systolic function is normal. The right ventricular  size is normal. There is normal pulmonary artery systolic pressure.   3. The mitral valve is normal in structure. Mild mitral valve  regurgitation. No evidence of mitral stenosis.   4. The aortic valve is normal in structure. Aortic valve regurgitation is  trivial. Aortic valve sclerosis is present, with no evidence of aortic  valve stenosis.   LHC 08/2022 1.  Severe ostial left main stenosis with hazy appearance  highly suggestive of acute plaque rupture.  No other obstructive disease. 2.  Severely reduced LV systolic function with an EF of 20% with akinesia of the mid to distal anterior, apical and infero-Apical myocardium. 3.  Successful angioplasty and drug-eluting stent placement to the ostial left main coronary artery.  Patient Profile     62 y.o. male presenting with cardiac arrest and anterior STEMI s/p drug-eluting stent to left main.  Hospital course complicated by high degree AV block, improved after stent placement.  Telemetry currently showing Mobitz 1 AV block.  Assessment & Plan    Anterior STEMI s/p DES to left main -Denies chest pain -Continue aspirin, Effient, Lipitor. -Avoid AV nodal agents due to heart block. -EF 45%, improved from initial during left heart cath.  2.  High degree AV block -Improved after stent placement -Telemetry over the past 24 to 48 hours showing Mobitz 1 AV block. -No indication for pacemaker -Monitor on telemetry over the next 24 hours, plan discharge with live ZIO monitor. -Appreciate input from EP.  3.  Mild to moderately reduced EF 40 to 45% -Appears euvolemic -Start losartan 12.5 mg daily -Avoid beta-blocker due to heart block as above.  Total encounter time more than 50 minutes  Greater than 50% was spent in counseling and coordination of care with the patient and family on the phone.    Signed, Kate Sable, MD  08/08/2022, 11:15 AM

## 2022-08-08 NOTE — Progress Notes (Signed)
   EP Rounding Note    Patient Name: Craig Reid Date of Encounter: 08/08/2022  [place] Cardiologist: Kathlyn Sacramento, MD   Subjective   NAEO. Walking around room. No syncope/presyncope. No CP.  Vital Signs    Vitals:   08/07/22 2108 08/07/22 2223 08/07/22 2347 08/08/22 0359  BP: 130/80 133/75 122/73 125/83  Pulse: 73 (!) 51 (!) 50 67  Resp: '20 18 20 20  '$ Temp: 97.6 F (36.4 C) 97.8 F (36.6 C) 98.2 F (36.8 C) (!) 97.4 F (36.3 C)  TempSrc:      SpO2: 99% 100% 99% 98%  Weight:      Height:        Intake/Output Summary (Last 24 hours) at 08/08/2022 0844 Last data filed at 08/07/2022 1912 Gross per 24 hour  Intake 977.33 ml  Output 1100 ml  Net -122.67 ml      08/06/2022    5:00 AM 04/21/2019    8:10 PM  Last 3 Weights  Weight (lbs) 130 lb 4.7 oz 110 lb  Weight (kg) 59.1 kg 49.896 kg      Telemetry    Sinus with very long wenckebach sequences (10-15 beats). No pauses. No VT. - Personally Reviewed  ECG    Personally Reviewed  Physical Exam   GEN: No acute distress.   Cardiac: RRR, no murmurs, rubs, or gallops.  Psych: Normal affect    Assessment & Plan    #Syncope #High degree AV block Resolved after LM PCI. He is doing well with near normal conduction overnight/this AM. No evidence of residual high degree AV block/CHB. He has wenckebach conduction. Given his AV conduction trajectory since PCI, I do not think a permanent pacemaker is indicated. Recommend continuing telemetry monitoring while inpatient and discharging with a Live Zio monitor.  Avoid AV nodal blockers. Agree with change to Prasugrel or Plavix.  Discussed plan with [facility] team and Dr Caryl Comes who consulted initially.  For questions or updates, please contact [place] Please consult [email or web address] for contact info under        Signed, Vickie Epley, MD  08/08/2022, 8:44 AM

## 2022-08-08 NOTE — Progress Notes (Signed)
[facility] Progress Note  Patient: Craig Reid    [id number]  DOA: 08/05/2022     Date of Service: the patient was seen and examined on 08/08/2022  Chief Complaint  Patient presents with   Chest Pain   Brief hospital course:  Craig Reid is a 62 y.o. male who speaks [place], with no significant past medical history.  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 Patient reports 15-minute episode of anterior chest discomfort/pain associated with sensation of shortness of breath, sweating and marked fatigue/presyncope.  Patient lost conscious and apparently became pulseless, bystander did CPR at the site.  EMS arrived and patient regained consciousness.  Patient was complaining of chest pain.  Patient was brought into the [facility] ED around 9 PM.  EMS noted inferior ST elevation changes, patient was taken to the Cath Lab.  Cardiology reported that patient had a STEMI most likely due to plaque rupture ostial left main which was successfully treated with DES stent, LVEF 20% with akinesis noticed.  Patient was admitted in the ICU, 12 antiplatelet therapy was started.  [facility] hospitalist was consulted for admission and further management.      Assessment and Plan:  # Anterior STMI, due to plaque rupture in the ostial left main but fortunately the vessel had near normal flow. No other obstructive disease.  Cardiology consulted, s/p successful PCI/DES ostial left main coronary artery.  S/p Brilinta 90 mg p.o. twice daily, it is not covered by patient's insurance so started prasugrel 10 mg pod and 30 mg loading dose given on 3/5 Continue aspirin 81 mg daily indefinitely.   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 Repeat TTE shows LVEF 40 to 45% Avoid beta-blocker due to high degree AV block/bradycardia 3/6 started losartan 12.5 mg p.o. daily Pulmonary pressure was normal  by echocardiogram.  Does not need any diuresis at this time Patient will need repeat 2D echocardiogram up to 2 months  # High degree AV block s/p anterior STEMI Patient's heart rate is improving P cardiology consult appreciated, recommended no need of pacemaker at this time, monitor on telemetry for 1 to 2 days and discharged on live ZIO monitor Continue to monitor on telemetry   # Syncope most likely due to acute MI Monitor vital signs Continue fall precautions We will ambulate patient before discharge  Hyperglycemia, could be stress-induced HbA1c 5.0, nondiabetic range. S/p NovoLog sliding scale, which has been discontinued.   Metabolic acidosis on admission most likely due to NSTEMI and syncope Acidosis resolved, bicarb within normal range  LFTs elevated could be secondary to syncopal episode and acute systolic CHF LFTs are trending down   Body mass index is 23.08 kg/m.  Interventions:    Diet: Heart healthy diet DVT Prophylaxis: Subcutaneous Lovenox   Advance goals of care discussion: Full code  Family Communication: family was present at bedside, at the time of interview.  The pt provided permission to discuss medical plan with the family. Opportunity was given to ask question and all questions were answered satisfactorily.   Disposition:  Pt is from Home, admitted with STEMI, still has high-grade AV block, which precludes a safe discharge. Discharge to home, when cleared by cardiology, may need 1-2 more days to improve..  Subjective: No significant overnight events, patient denies any chest pain today, no any shortness of breath, no palpitations.  Patient was resting comfortably, did not offer any complaints.  Physical Exam:  General: NAD, lying comfortably Appear in no distress, affect appropriate Eyes: PERRLA ENT: Oral Mucosa Clear, moist  Neck: no JVD,  Cardiovascular: S1 and S2 Present, no Murmur,  Respiratory: good respiratory effort, Bilateral Air entry  equal and Decreased, no Crackles, no wheezes Abdomen: Bowel Sound present, Soft and no tenderness,  Skin: no rashes Extremities: no Pedal edema, no calf tenderness Neurologic: without any new focal findings Gait not checked due to patient safety concerns  Vitals:   08/07/22 2347 08/08/22 0359 08/08/22 0900 08/08/22 1221  BP: 122/73 125/83 131/85 116/84  Pulse: (!) 50 67 68 66  Resp: '20 20 20 20  '$ Temp: 98.2 F (36.8 C) (!) 97.4 F (36.3 C) 97.8 F (36.6 C) 97.7 F (36.5 C)  TempSrc:   Oral Oral  SpO2: 99% 98% 100% 99%  Weight:      Height:        Intake/Output Summary (Last 24 hours) at 08/08/2022 1353 Last data filed at 08/07/2022 1912 Gross per 24 hour  Intake 0 ml  Output 450 ml  Net -450 ml   Filed Weights   08/06/22 0500  Weight: 59.1 kg    Data Reviewed: I have personally reviewed and interpreted daily labs, tele strips, imagings as discussed above. I reviewed all nursing notes, pharmacy notes, vitals, pertinent old records I have discussed plan of care as described above with RN and patient/family.  CBC: Recent Labs  Lab 08/05/22 2145 08/06/22 0506 08/07/22 0328 08/08/22 0231  WBC 7.4 8.4 6.6 8.6  NEUTROABS 4.0  --   --   --   HGB 13.9 13.5 13.7 15.1  HCT 43.8 41.0 43.2 45.6  MCV 98.0 94.7 97.7 95.6  PLT 214 208 201 [id number]   Basic Metabolic Panel: Recent Labs  Lab 08/05/22 2145 08/06/22 0504 08/06/22 0506 08/07/22 0328 08/08/22 0231  NA 138  --  140 134* 137  K 3.7  --  3.5 3.7 3.4*  CL 107  --  109 108 106  CO2 19*  --  '23 22 22  '$ GLUCOSE 169*  --  107* 110* 92  BUN 22*  --  17 18 21*  CREATININE 1.12  --  0.75 0.91 0.87  CALCIUM 8.7*  --  9.1 8.8* 9.1  MG  --  2.1  --  2.0 2.1  PHOS  --  3.2  --  3.1 3.7    Studies: No results found.  Scheduled Meds:  aspirin  81 mg Oral Daily   atorvastatin  80 mg Oral Daily   Chlorhexidine Gluconate Cloth  6 each Topical Daily   enoxaparin (LOVENOX) injection  40 mg Subcutaneous Q24H   losartan  12.5  mg Oral Daily   prasugrel  10 mg Oral Daily   sodium chloride flush  3 mL Intravenous Q12H   sodium chloride flush  3 mL Intravenous Q12H   Continuous Infusions:  sodium chloride     PRN Meds: sodium chloride, acetaminophen **OR** acetaminophen, ondansetron (ZOFRAN) IV, sodium chloride flush  Time spent: 35 minutes  Author: Val Riles. MD [facility] 08/08/2022 1:53 PM  To reach On-call, see care teams to locate the attending and reach out to them via [email or web address]. If 7PM-7AM, please contact night-coverage If you still have difficulty reaching the attending provider, please page the [facility] (Director on Call) for [facility] on amion for assistance.

## 2022-08-09 ENCOUNTER — Telehealth: Payer: Self-pay | Admitting: Cardiovascular Disease

## 2022-08-09 ENCOUNTER — Inpatient Hospital Stay (HOSPITAL_COMMUNITY)
Admit: 2022-08-09 | Discharge: 2022-08-09 | Disposition: A | Discharge status: Home / Self Care | Payer: Medicaid Other | Attending: Cardiology | Admitting: Cardiology

## 2022-08-09 DIAGNOSIS — R001 Bradycardia, unspecified: Secondary | ICD-10-CM

## 2022-08-09 DIAGNOSIS — I420 Dilated cardiomyopathy: Secondary | ICD-10-CM

## 2022-08-09 DIAGNOSIS — R55 Syncope and collapse: Secondary | ICD-10-CM

## 2022-08-09 LAB — BASIC METABOLIC PANEL
Anion gap: 9 (ref 5–15)
BUN: 29 mg/dL — ABNORMAL HIGH (ref 6–20)
CO2: 21 mmol/L — ABNORMAL LOW (ref 22–32)
Calcium: 9.5 mg/dL (ref 8.9–10.3)
Chloride: 107 mmol/L (ref 98–111)
Creatinine, Ser: 0.88 mg/dL (ref 0.61–1.24)
GFR, Estimated: >60 mL/min (ref >60)
Glucose, Bld: 92 mg/dL (ref 70–99)
Potassium: 4 mmol/L (ref 3.5–5.1)
Sodium: 137 mmol/L (ref 135–145)

## 2022-08-09 LAB — GLUCOSE, CAPILLARY
Glucose-Capillary: 108 mg/dL — ABNORMAL HIGH (ref 70–99)
Glucose-Capillary: 120 mg/dL — ABNORMAL HIGH (ref 70–99)
Glucose-Capillary: 92 mg/dL (ref 70–99)
Glucose-Capillary: 90 mg/dL (ref 70–99)

## 2022-08-09 LAB — PHOSPHORUS: Phosphorus: 3.9 mg/dL (ref 2.5–4.6)

## 2022-08-09 LAB — CBC
HCT: 45.8 % (ref 39.0–52.0)
Hemoglobin: 15.2 g/dL (ref 13.0–17.0)
MCH: 31.3 pg (ref 26.0–34.0)
MCHC: 33.2 g/dL (ref 30.0–36.0)
MCV: 94.2 fL (ref 80.0–100.0)
Platelets: 247 10*3/uL (ref 150–400)
RBC: 4.86 MIL/uL (ref 4.22–5.81)
RDW: 11.6 % (ref 11.5–15.5)
WBC: 8.2 10*3/uL (ref 4.0–10.5)
nRBC: 0 % (ref 0.0–0.2)

## 2022-08-09 LAB — MAGNESIUM: Magnesium: 2.1 mg/dL (ref 1.7–2.4)

## 2022-08-09 MED ORDER — ATORVASTATIN CALCIUM 80 MG PO TABS: 80.0000 mg | ORAL_TABLET | Freq: Every day | ORAL | 3 refills | Status: DC

## 2022-08-09 MED ORDER — LOSARTAN POTASSIUM 25 MG PO TABS: 25.0000 mg | ORAL_TABLET | Freq: Every day | ORAL | 0 refills | Status: DC

## 2022-08-09 MED ORDER — LOSARTAN POTASSIUM 25 MG PO TABS
25.0000 mg | ORAL_TABLET | Freq: Every day | ORAL | Status: DC
  Administered 2022-08-10: 25 mg via ORAL
  Filled 2022-08-09: qty 1

## 2022-08-09 MED ORDER — PRASUGREL HCL 10 MG PO TABS: 10.0000 mg | ORAL_TABLET | Freq: Every day | ORAL | 3 refills | Status: DC

## 2022-08-09 MED ORDER — PANTOPRAZOLE SODIUM 40 MG PO TBEC: 40.0000 mg | DELAYED_RELEASE_TABLET | Freq: Every day | ORAL | 3 refills | Status: DC

## 2022-08-09 MED ORDER — ASPIRIN 81 MG PO CHEW: 81.0000 mg | CHEWABLE_TABLET | Freq: Every day | ORAL | 3 refills | Status: DC

## 2022-08-09 NOTE — Telephone Encounter (Signed)
Call came in at 3:53 pm from [facility]. Dr. Fletcher Anon notified at 3:54 pm. No strips were available. Called in at 3:56 pm requesting report. Report came in 4:14 pm. Notified Dr. Rockey Situ & Barbera Setters at 4:24 pm. Patient is currently admitted.

## 2022-08-09 NOTE — Progress Notes (Signed)
[facility] Progress Note  Patient: Craig Reid    [id number]  DOA: 08/05/2022     Date of Service: the patient was seen and examined on 08/09/2022  Chief Complaint  Patient presents with   Chest Pain   Brief hospital course:  Craig Reid is a 62 y.o. male who speaks [place], with no significant past medical history.  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 Patient reports 15-minute episode of anterior chest discomfort/pain associated with sensation of shortness of breath, sweating and marked fatigue/presyncope.  Patient lost conscious and apparently became pulseless, bystander did CPR at the site.  EMS arrived and patient regained consciousness.  Patient was complaining of chest pain.  Patient was brought into the [facility] ED around 9 PM.  EMS noted inferior ST elevation changes, patient was taken to the Cath Lab.  Cardiology reported that patient had a STEMI most likely due to plaque rupture ostial left main which was successfully treated with DES stent, LVEF 20% with akinesis noticed.  Patient was admitted in the ICU, 12 antiplatelet therapy was started.  [facility] hospitalist was consulted for admission and further management.      Assessment and Plan:  # Anterior STMI, due to plaque rupture in the ostial left main but fortunately the vessel had near normal flow. No other obstructive disease.  Cardiology consulted, s/p successful PCI/DES ostial left main coronary artery.  S/p Brilinta 90 mg p.o. twice daily, it is not covered by patient's insurance so started prasugrel 10 mg pod and 30 mg loading dose given on 3/5 Continue aspirin 81 mg daily indefinitely.   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 Repeat TTE shows LVEF 40 to 45% Avoid beta-blocker due to high degree AV block/bradycardia 3/7 increased losartan 25 mg p.o. daily Pulmonary pressure was normal  by echocardiogram.  Does not need any diuresis at this time Patient will need repeat 2D echocardiogram up to 2 months  # High degree AV block s/p anterior STEMI Patient's heart rate is improving P cardiology consult appreciated, recommended no need of pacemaker at this time, monitor on telemetry for 1 to 2 days and discharge on live ZIO monitor Continue to monitor on telemetry   # Syncope most likely due to acute MI Monitor vital signs Continue fall precautions We will ambulate patient before discharge  Hyperglycemia, could be stress-induced HbA1c 5.0, nondiabetic range. S/p NovoLog sliding scale, which has been discontinued.   Metabolic acidosis on admission most likely due to NSTEMI and syncope Acidosis resolved, bicarb within normal range  LFTs elevated could be secondary to syncopal episode and acute systolic CHF LFTs are trending down   Body mass index is 23.08 kg/m.  Interventions:    Diet: Heart healthy diet DVT Prophylaxis: Subcutaneous Lovenox   Advance goals of care discussion: Full code  Family Communication: family was present at bedside, at the time of interview.  The pt provided permission to discuss medical plan with the family. Opportunity was given to ask question and all questions were answered satisfactorily.   Disposition:  Pt is from Home, admitted with STEMI, Mobitz type I AV block, on telemetry.  Cardiac monitor was placed.  Medically optimized and clinically stable to discharge home tomorrow a.m Patient's caregiver could not pick him today so plan is to discharge him tomorrow a.m  Subjective: No significant overnight events, patient denies any chest pain today, no any shortness of breath, no  palpitations.  Patient was resting comfortably, did not offer any complaints.  Physical Exam: General: NAD, lying comfortably Appear in no distress, affect appropriate Eyes: PERRLA ENT: Oral Mucosa Clear, moist  Neck: no JVD,  Cardiovascular: S1 and S2  Present, no Murmur,  Respiratory: good respiratory effort, Bilateral Air entry equal and Decreased, no Crackles, no wheezes Abdomen: Bowel Sound present, Soft and no tenderness,  Skin: no rashes Extremities: no Pedal edema, no calf tenderness Neurologic: without any new focal findings Gait not checked due to patient safety concerns  Vitals:   08/08/22 2324 08/09/22 0424 08/09/22 0741 08/09/22 1134  BP: 114/83 117/81 (!) 124/93 121/79  Pulse: 77 68 69 79  Resp: '16 16 16 20  '$ Temp: 97.6 F (36.4 C) 98.2 F (36.8 C) 98 F (36.7 C) 97.9 F (36.6 C)  TempSrc: Oral Oral Oral Oral  SpO2: 98% 99% 98% 100%  Weight:  48.3 kg    Height:        Intake/Output Summary (Last 24 hours) at 08/09/2022 1502 Last data filed at 08/09/2022 1016 Gross per 24 hour  Intake 69 ml  Output --  Net 69 ml   Filed Weights   08/06/22 0500 08/09/22 0424  Weight: 59.1 kg 48.3 kg    Data Reviewed: I have personally reviewed and interpreted daily labs, tele strips, imagings as discussed above. I reviewed all nursing notes, pharmacy notes, vitals, pertinent old records I have discussed plan of care as described above with RN and patient/family.  CBC: Recent Labs  Lab 08/05/22 2145 08/06/22 0506 08/07/22 0328 08/08/22 0231 08/09/22 0302  WBC 7.4 8.4 6.6 8.6 8.2  NEUTROABS 4.0  --   --   --   --   HGB 13.9 13.5 13.7 15.1 15.2  HCT 43.8 41.0 43.2 45.6 45.8  MCV 98.0 94.7 97.7 95.6 94.2  PLT 214 208 201 224 [id number]   Basic Metabolic Panel: Recent Labs  Lab 08/05/22 2145 08/06/22 0504 08/06/22 0506 08/07/22 0328 08/08/22 0231 08/09/22 0302  NA 138  --  140 134* 137 137  K 3.7  --  3.5 3.7 3.4* 4.0  CL 107  --  109 108 106 107  CO2 19*  --  '23 22 22 '$ 21*  GLUCOSE 169*  --  107* 110* 92 92  BUN 22*  --  17 18 21* 29*  CREATININE 1.12  --  0.75 0.91 0.87 0.88  CALCIUM 8.7*  --  9.1 8.8* 9.1 9.5  MG  --  2.1  --  2.0 2.1 2.1  PHOS  --  3.2  --  3.1 3.7 3.9    Studies: No results found.   Scheduled Meds:  aspirin  81 mg Oral Daily   atorvastatin  80 mg Oral Daily   enoxaparin (LOVENOX) injection  40 mg Subcutaneous Q24H   [START ON 08/10/2022] losartan  25 mg Oral Daily   prasugrel  10 mg Oral Daily   sodium chloride flush  3 mL Intravenous Q12H   sodium chloride flush  3 mL Intravenous Q12H   Continuous Infusions:  sodium chloride     PRN Meds: sodium chloride, acetaminophen **OR** acetaminophen, ondansetron (ZOFRAN) IV, sodium chloride flush  Time spent: 35 minutes  Author: Val Riles. MD [facility] 08/09/2022 3:02 PM  To reach On-call, see care teams to locate the attending and reach out to them via [email or web address]. If 7PM-7AM, please contact night-coverage If you still have difficulty reaching the attending provider, please page the [facility] (Director on  Call) for [facility] on amion for assistance.

## 2022-08-09 NOTE — Progress Notes (Signed)
Rounding Note    Patient Name: Barclay Look Date of Encounter: 08/09/2022  [place] Cardiologist: Kathlyn Sacramento, MD   Subjective   Reports feeling well, no complaints Ambulating, denies chest pain or shortness of breath Telemetry reviewed, sinus rhythm with second-degree AV block type I Bradycardia when sleeping  Inpatient Medications    Scheduled Meds:  aspirin  81 mg Oral Daily   atorvastatin  80 mg Oral Daily   enoxaparin (LOVENOX) injection  40 mg Subcutaneous Q24H   losartan  12.5 mg Oral Daily   prasugrel  10 mg Oral Daily   sodium chloride flush  3 mL Intravenous Q12H   sodium chloride flush  3 mL Intravenous Q12H   Continuous Infusions:  sodium chloride     PRN Meds: sodium chloride, acetaminophen **OR** acetaminophen, ondansetron (ZOFRAN) IV, sodium chloride flush   Vital Signs    Vitals:   08/08/22 2324 08/09/22 0424 08/09/22 0741 08/09/22 1134  BP: 114/83 117/81 (!) 124/93 121/79  Pulse: 77 68 69 79  Resp: '16 16 16 20  '$ Temp: 97.6 F (36.4 C) 98.2 F (36.8 C) 98 F (36.7 C) 97.9 F (36.6 C)  TempSrc: Oral Oral Oral Oral  SpO2: 98% 99% 98% 100%  Weight:  48.3 kg    Height:        Intake/Output Summary (Last 24 hours) at 08/09/2022 1325 Last data filed at 08/09/2022 1016 Gross per 24 hour  Intake 69 ml  Output --  Net 69 ml      08/09/2022    4:24 AM 08/06/2022    5:00 AM 04/21/2019    8:10 PM  Last 3 Weights  Weight (lbs) 106 lb 7.7 oz 130 lb 4.7 oz 110 lb  Weight (kg) 48.3 kg 59.1 kg 49.896 kg      Telemetry    Sinus rhythm with second-degree AV block type I- Personally Reviewed  ECG     - Personally Reviewed  Physical Exam   GEN: No acute distress.   Neck: No JVD Cardiac: RRR, no murmurs, rubs, or gallops.  Respiratory: Clear to auscultation bilaterally. GI: Soft, nontender, non-distended  MS: No edema; No deformity. Neuro:  Nonfocal  Psych: Normal affect   Labs    High Sensitivity Troponin:   Recent  Labs  Lab 08/05/22 2145 08/06/22 0012 08/06/22 0506  TROPONINIHS 15 389* 548*     Chemistry Recent Labs  Lab 08/05/22 2145 08/06/22 0504 08/06/22 0506 08/07/22 0328 08/08/22 0231 08/09/22 0302  NA 138  --  140 134* 137 137  K 3.7  --  3.5 3.7 3.4* 4.0  CL 107  --  109 108 106 107  CO2 19*  --  '23 22 22 '$ 21*  GLUCOSE 169*  --  107* 110* 92 92  BUN 22*  --  17 18 21* 29*  CREATININE 1.12  --  0.75 0.91 0.87 0.88  CALCIUM 8.7*  --  9.1 8.8* 9.1 9.5  MG  --    < >  --  2.0 2.1 2.1  PROT 7.4  --  7.6 7.1  --   --   ALBUMIN 3.6  --  3.8 3.4*  --   --   AST 108*  --  79* 43*  --   --   ALT 68*  --  76* 56*  --   --   ALKPHOS 69  --  69 61  --   --   BILITOT 0.8  --  1.2 1.4*  --   --  GFRNONAA >60  --  >60 >60 >60 >60  ANIONGAP 12  --  8 4* 9 9   < > = values in this interval not displayed.    Lipids  Recent Labs  Lab 08/05/22 2145  CHOL 155  TRIG 78  HDL 58  LDLCALC 81  CHOLHDL 2.7    Hematology Recent Labs  Lab 08/07/22 0328 08/08/22 0231 08/09/22 0302  WBC 6.6 8.6 8.2  RBC 4.42 4.77 4.86  HGB 13.7 15.1 15.2  HCT 43.2 45.6 45.8  MCV 97.7 95.6 94.2  MCH 31.0 31.7 31.3  MCHC 31.7 33.1 33.2  RDW 11.8 11.5 11.6  PLT 201 224 247   Thyroid  Recent Labs  Lab 08/07/22 1750  TSH 2.107  FREET4 0.88    BNP Recent Labs  Lab 08/06/22 0504  BNP 191.1*    DDimer No results for input(s): "DDIMER" in the last 168 hours.   Radiology    No results found.  Cardiac Studies   TTE 08/2022 1. Left ventricular ejection fraction, by estimation, is 40 to 45%. The  left ventricle has mildly decreased function. The left ventricle  demonstrates global hypokinesis. Left ventricular diastolic parameters are  indeterminate.   2. Right ventricular systolic function is normal. The right ventricular  size is normal. There is normal pulmonary artery systolic pressure.   3. The mitral valve is normal in structure. Mild mitral valve  regurgitation. No evidence of mitral  stenosis.   4. The aortic valve is normal in structure. Aortic valve regurgitation is  trivial. Aortic valve sclerosis is present, with no evidence of aortic  valve stenosis.    LHC 08/2022 1.  Severe ostial left main stenosis with hazy appearance highly suggestive of acute plaque rupture.  No other obstructive disease. 2.  Severely reduced LV systolic function with an EF of 20% with akinesia of the mid to distal anterior, apical and infero-Apical myocardium. 3.  Successful angioplasty and drug-eluting stent placement to the ostial left main coronary artery.  Patient Profile     62 y.o. male presenting with cardiac arrest and anterior STEMI s/p drug-eluting stent to left main.  Hospital course complicated by high degree AV block, improved after stent placement.    Assessment & Plan    Anterior STEMI s/p DES to left main Taken to catheterization lab August 05, 2022, EF at that time estimated 25% or less Echo August 06, 2022 EF 40 to 45% -Plan to continue aspirin, Effient, Lipitor.  Losartan up to 25 daily starting tomorrow -Not on beta-blocker secondary to heart block    2.  High degree AV block -Improved after stent placement -Telemetry over the past 48 hours showing Mobitz 1 AV block. -No indication for pacemaker -Plan for live ZIO monitor at discharge, outpatient follow-up with EP  3.  Mild to moderately reduced EF 40 to 45% Losartan 25 daily, not on beta-blocker secondary to heart block   Total encounter time more than 50 minutes  Greater than 50% was spent in counseling and coordination of care with the patient   For questions or updates, please contact [place] Please consult [email or web address] for contact info under        Signed, Ida Rogue, MD  08/09/2022, 1:25 PM

## 2022-08-09 NOTE — Telephone Encounter (Signed)
[facility] calling with abnormal results

## 2022-08-09 NOTE — Telephone Encounter (Signed)
Call received from [facility] with abnormal heart monitor. Patient was in complete heart block at 2:15 pm pause of 7.6 seconds and another one at 2:40 PM at 6.7 seconds. Patient is currently admitted in the hospital here at [facility] Waiting on monitor to be posted.

## 2022-08-09 NOTE — TOC Initial Note (Signed)
Transition of Care [facility]) - Initial/Assessment Note    Patient Details  Name: Craig Reid MRN: [id number] Date of Birth: Apr 07, 1961  Transition of Care [facility]) CM/SW Contact:    Laurena Slimmer, RN Phone Number: 08/09/2022, 10:27 AM  Clinical Narrative:                  Transition of Care (TOC) Screening Note   Patient Details  Name: Craig Reid Date of Birth: 1960-10-19   Transition of Care [facility]) CM/SW Contact:    Laurena Slimmer, RN Phone Number: 08/09/2022, 10:27 AM    Transition of Care Department (TOC) has reviewed patient and no TOC needs have been identified at this time. We will continue to monitor patient advancement through interdisciplinary progression rounds. If new patient transition needs arise, please place a TOC consult.          Patient Goals and CMS Choice            Expected Discharge Plan and Services                                              Prior Living Arrangements/Services                       Activities of Daily Living Home Assistive Devices/Equipment: None ADL Screening (condition at time of admission) Patient's cognitive ability adequate to safely complete daily activities?: Yes Is the patient deaf or have difficulty hearing?: No Does the patient have difficulty seeing, even when wearing glasses/contacts?: No Does the patient have difficulty concentrating, remembering, or making decisions?: No Patient able to express need for assistance with ADLs?: Yes Does the patient have difficulty dressing or bathing?: No Independently performs ADLs?: Yes (appropriate for developmental age) Does the patient have difficulty walking or climbing stairs?: No Weakness of Legs: None Weakness of Arms/Hands: None  Permission Sought/Granted                  Emotional Assessment              Admission diagnosis:  Acute ST elevation myocardial infarction (STEMI) of anterior wall (HCC) [I21.09] CAD  (coronary artery disease) [I25.10] Patient Active Problem List   Diagnosis Date Noted   Cardiomyopathy ([place]) 08/08/2022   AV block, Mobitz 1 08/08/2022   Syncope 08/06/2022   Bradycardia 08/06/2022   CAD S/P percutaneous coronary angioplasty 08/06/2022   Acidosis 08/06/2022   LFT elevation 08/06/2022   Acute CHF (congestive heart failure) ([place]) 08/06/2022   ACS (acute coronary syndrome) ([place]) 08/06/2022   ST elevation myocardial infarction (STEMI) ([place]) 08/06/2022   Gastric perforation ([place]) 04/21/2019   PCP:  Gregor Hams, FNP Pharmacy:   [facility] [address] [place] [address] [place] [address] Phone: [phone] Fax: [phone]     Social Determinants of Health (SDOH) Social History: SDOH Screenings   Food Insecurity: No Food Insecurity (08/06/2022)  Housing: Low Risk  (08/06/2022)  Transportation Needs: No Transportation Needs (08/06/2022)  Utilities: Not At Risk (08/06/2022)  Social Connections: Unknown (08/06/2022)  Tobacco Use: Low Risk  (08/06/2022)   SDOH Interventions: Food Insecurity Interventions: Patient Refused Housing Interventions: Patient Refused Utilities Interventions: Patient Refused, Intervention Not Indicated Social Connections Interventions: Intervention Not Indicated   Readmission Risk Interventions     No data to display

## 2022-08-10 DIAGNOSIS — R55 Syncope and collapse: Secondary | ICD-10-CM | POA: Diagnosis not present

## 2022-08-10 DIAGNOSIS — I213 ST elevation (STEMI) myocardial infarction of unspecified site: Secondary | ICD-10-CM

## 2022-08-10 DIAGNOSIS — R072 Precordial pain: Secondary | ICD-10-CM

## 2022-08-10 LAB — CBC
HCT: 46.9 % (ref 39.0–52.0)
Hemoglobin: 15.4 g/dL (ref 13.0–17.0)
MCH: 30.6 pg (ref 26.0–34.0)
MCHC: 32.8 g/dL (ref 30.0–36.0)
MCV: 93.2 fL (ref 80.0–100.0)
Platelets: 255 10*3/uL (ref 150–400)
RBC: 5.03 MIL/uL (ref 4.22–5.81)
RDW: 11.4 % — ABNORMAL LOW (ref 11.5–15.5)
WBC: 8.5 10*3/uL (ref 4.0–10.5)
nRBC: 0 % (ref 0.0–0.2)

## 2022-08-10 LAB — BASIC METABOLIC PANEL
Anion gap: 10 (ref 5–15)
BUN: 27 mg/dL — ABNORMAL HIGH (ref 6–20)
CO2: 23 mmol/L (ref 22–32)
Calcium: 9.3 mg/dL (ref 8.9–10.3)
Chloride: 102 mmol/L (ref 98–111)
Creatinine, Ser: 0.81 mg/dL (ref 0.61–1.24)
GFR, Estimated: >60 mL/min (ref >60)
Glucose, Bld: 93 mg/dL (ref 70–99)
Potassium: 3.6 mmol/L (ref 3.5–5.1)
Sodium: 135 mmol/L (ref 135–145)

## 2022-08-10 LAB — GLUCOSE, CAPILLARY
Glucose-Capillary: 95 mg/dL (ref 70–99)
Glucose-Capillary: 113 mg/dL — ABNORMAL HIGH (ref 70–99)

## 2022-08-10 LAB — PHOSPHORUS: Phosphorus: 4 mg/dL (ref 2.5–4.6)

## 2022-08-10 LAB — MAGNESIUM: Magnesium: 2.1 mg/dL (ref 1.7–2.4)

## 2022-08-10 NOTE — Written Directive (Signed)
    08/10/2022  To whom it may concern,  Mr. Craig Reid received medical care at [facility] from 08/05/2022 - 08/10/2022. Please excuse him from his roles and responsibilities during this time.  He should be off work for at least 2 weeks (until August 24 2022).   He should not drive or operate any heavy machinery until he is given clearance from his PCP and/or cardiologist.  Sincerely,  Lucienne Minks MD Medical Attending

## 2022-08-10 NOTE — Progress Notes (Signed)
Rounding Note    Patient Name: Craig Reid Date of Encounter: 08/10/2022  [place] Cardiologist: Kathlyn Sacramento, MD   Subjective   Sitting up in bed, dressed, niece at the bedside ready to pick him up Denies any significant chest pain, no shortness of breath, no orthostasis symptoms Yesterday continued sinus rhythm with second-degree AV block type I Junctional escape beats Bradycardia when sleeping  Inpatient Medications    Scheduled Meds:  aspirin  81 mg Oral Daily   atorvastatin  80 mg Oral Daily   enoxaparin (LOVENOX) injection  40 mg Subcutaneous Q24H   losartan  25 mg Oral Daily   prasugrel  10 mg Oral Daily   sodium chloride flush  3 mL Intravenous Q12H   sodium chloride flush  3 mL Intravenous Q12H   Continuous Infusions:  sodium chloride     PRN Meds: sodium chloride, acetaminophen **OR** acetaminophen, ondansetron (ZOFRAN) IV, sodium chloride flush   Vital Signs    Vitals:   08/09/22 2003 08/10/22 0555 08/10/22 0811 08/10/22 1119  BP: 115/80 124/87 (!) 131/94 124/86  Pulse: 61 78 78 84  Resp: 16 18  (!) 22  Temp: (!) 97.5 F (36.4 C) (!) 97.4 F (36.3 C) 97.9 F (36.6 C) 98.1 F (36.7 C)  TempSrc:  Oral Oral   SpO2: 98% 100% 100% 100%  Weight:      Height:        Intake/Output Summary (Last 24 hours) at 08/10/2022 1549 Last data filed at 08/10/2022 1054 Gross per 24 hour  Intake 306 ml  Output --  Net 306 ml      08/09/2022    4:24 AM 08/06/2022    5:00 AM 04/21/2019    8:10 PM  Last 3 Weights  Weight (lbs) 106 lb 7.7 oz 130 lb 4.7 oz 110 lb  Weight (kg) 48.3 kg 59.1 kg 49.896 kg      Telemetry    Sinus rhythm with second-degree AV block type I- Personally Reviewed  ECG     - Personally Reviewed  Physical Exam   Constitutional:  oriented to person, place, and time. No distress.  HENT:  Head: Grossly normal Eyes:  no discharge. No scleral icterus.  Neck: No JVD, no carotid bruits  Cardiovascular: Regular rate  and rhythm, no murmurs appreciated Pulmonary/Chest: Clear to auscultation bilaterally, no wheezes or rails Abdominal: Soft.  no distension.  no tenderness.  Musculoskeletal: Normal range of motion Neurological:  normal muscle tone. Coordination normal. No atrophy Skin: Skin warm and dry Psychiatric: normal affect, pleasant   Labs    High Sensitivity Troponin:   Recent Labs  Lab 08/05/22 2145 08/06/22 0012 08/06/22 0506  TROPONINIHS 15 389* 548*     Chemistry Recent Labs  Lab 08/05/22 2145 08/06/22 0504 08/06/22 0506 08/07/22 0328 08/08/22 0231 08/09/22 0302 08/10/22 0309  NA 138  --  140 134* 137 137 135  K 3.7  --  3.5 3.7 3.4* 4.0 3.6  CL 107  --  109 108 106 107 102  CO2 19*  --  '23 22 22 '$ 21* 23  GLUCOSE 169*  --  107* 110* 92 92 93  BUN 22*  --  17 18 21* 29* 27*  CREATININE 1.12  --  0.75 0.91 0.87 0.88 0.81  CALCIUM 8.7*  --  9.1 8.8* 9.1 9.5 9.3  MG  --    < >  --  2.0 2.1 2.1 2.1  PROT 7.4  --  7.6 7.1  --   --   --  ALBUMIN 3.6  --  3.8 3.4*  --   --   --   AST 108*  --  79* 43*  --   --   --   ALT 68*  --  76* 56*  --   --   --   ALKPHOS 69  --  69 61  --   --   --   BILITOT 0.8  --  1.2 1.4*  --   --   --   GFRNONAA >60  --  >60 >60 >60 >60 >60  ANIONGAP 12  --  8 4* '9 9 10   '$ < > = values in this interval not displayed.    Lipids  Recent Labs  Lab 08/05/22 2145  CHOL 155  TRIG 78  HDL 58  LDLCALC 81  CHOLHDL 2.7    Hematology Recent Labs  Lab 08/08/22 0231 08/09/22 0302 08/10/22 0309  WBC 8.6 8.2 8.5  RBC 4.77 4.86 5.03  HGB 15.1 15.2 15.4  HCT 45.6 45.8 46.9  MCV 95.6 94.2 93.2  MCH 31.7 31.3 30.6  MCHC 33.1 33.2 32.8  RDW 11.5 11.6 11.4*  PLT 224 247 255   Thyroid  Recent Labs  Lab 08/07/22 1750  TSH 2.107  FREET4 0.88    BNP Recent Labs  Lab 08/06/22 0504  BNP 191.1*    DDimer No results for input(s): "DDIMER" in the last 168 hours.   Radiology    No results found.  Cardiac Studies   TTE 08/2022 1. Left  ventricular ejection fraction, by estimation, is 40 to 45%. The  left ventricle has mildly decreased function. The left ventricle  demonstrates global hypokinesis. Left ventricular diastolic parameters are  indeterminate.   2. Right ventricular systolic function is normal. The right ventricular  size is normal. There is normal pulmonary artery systolic pressure.   3. The mitral valve is normal in structure. Mild mitral valve  regurgitation. No evidence of mitral stenosis.   4. The aortic valve is normal in structure. Aortic valve regurgitation is  trivial. Aortic valve sclerosis is present, with no evidence of aortic  valve stenosis.    LHC 08/2022 1.  Severe ostial left main stenosis with hazy appearance highly suggestive of acute plaque rupture.  No other obstructive disease. 2.  Severely reduced LV systolic function with an EF of 20% with akinesia of the mid to distal anterior, apical and infero-Apical myocardium. 3.  Successful angioplasty and drug-eluting stent placement to the ostial left main coronary artery.  Patient Profile     62 y.o. male presenting with cardiac arrest and anterior STEMI s/p drug-eluting stent to left main.  Hospital course complicated by high degree AV block, improved after stent placement.    Assessment & Plan    Anterior STEMI s/p DES to left main Taken to catheterization lab August 05, 2022, EF at that time estimated 25% or less Echo August 06, 2022 EF 40 to 45% -We have recommended he continue aspirin, Effient, Lipitor losartan 25 -Not on beta-blocker secondary to heart block    2.  High degree AV block -Improved after stent placement Still with significant second-degree AV block type I, junctional escape beats -Has Zio monitor in place, outpatient follow-up with EP to review monitor results -No indication for pacemaker He is asymptomatic  3.  Mild to moderately reduced EF 40 to 45% Losartan 25 daily, not on beta-blocker secondary to heart  block  Discharge instruction with patient and his niece, she assisted  given language barrier, return to work instructions discussed  Total encounter time more than 50 minutes  Greater than 50% was spent in counseling and coordination of care with the patient   For questions or updates, please contact [place] Please consult [email or web address] for contact info under        Signed, Ida Rogue, MD  08/10/2022, 3:49 PM

## 2022-08-10 NOTE — Discharge Summary (Signed)
Physician Discharge Summary   Patient: Craig Reid MRN: [id number] DOB: 1960/06/14  Admit date:     08/05/2022  Discharge date: 08/10/22  Discharge Physician: Lucienne Minks    PCP: Gregor Hams, FNP   Recommendations at discharge:    Please take the new cardiac medications as prescribed. Please f/u with cardiology as an outpt  Discharge Diagnoses: Active Problems:   Syncope and collapse   Bradycardia   CAD S/P percutaneous coronary angioplasty   Acidosis   LFT elevation   Acute CHF (congestive heart failure) (HCC)   ACS (acute coronary syndrome) (HCC)   ST elevation myocardial infarction (STEMI) (HCC)   Cardiomyopathy (HCC)   AV block, Mobitz 1  Resolved Problems:   * No resolved hospital problems. *  Hospital Course: 62 yo M who was treated for a STEMI. EMS noted inferior ST elevation changes, patient was taken to the Cath Lab. Cardiology reported that patient had a STEMI most likely due to plaque rupture ostial left main which was successfully treated with DES stent, LVEF 20% with akinesis noticed. Patient was admitted in the ICU, 12 antiplatelet therapy was started.  Repeat TTE showed LVEF 40-45%.  Pt was placed on aspirin, lipitor, losartan, prasugrel. Pt will need to follow up with his cardiologist as an outpt.   Assessment and Plan: ACS (acute coronary syndrome) (HCC) Strong suspicion of left main plaque rupture that has been treated with drug-eluting stent placement.  Trend troponins  Acute CHF (congestive heart failure) (HCC) Evident by patient's left ventriculography in Cath Lab.  Echo pending.  At this time not a candidate for beta-blockers or ACE inhibitors.  Maintain on telemetry given significant reduction in ejection fraction.  Etiology is felt to be coronary artery disease  LFT elevation Likely due to hypoperfusion of the liver during syncope/hypotensive episode.  We will monitor  Acidosis Mild in the setting of receiving several fluids that are  relatively acidotic.  At this time I will trend.  CAD S/P percutaneous coronary angioplasty Orders have been put in by cardiology in this regard in terms of antiplatelet therapy statin.  Follow-up hemoglobin A1c.  Patient is not a candidate for beta-blocker or any agent that might reduce blood pressure at this time groin dressing as per cardiology  Bradycardia Ending with clinical monitoring to see if this resolves on its own as the myocardium is better perfused after an PCI or patient needs pacemaker.  Serial EKGs are pending to monitor rhythm  Syncope and collapse Given the findings of significant cardiac dysfunction in terms of LV function as well as bradycardia at this time as well as intra coronary angiography findings, I do suspect that this was precipitated by an acute coronary syndrome.  Will check lower extremity Dopplers to rule out occult VTE.  Maintain on telemetry at this time        Consultants: Cardio Procedures performed: Cardiac cath   Disposition: Home Diet recommendation:  Discharge Diet Orders (From admission, onward)     Start     Ordered   08/09/22 0000  Diet - low sodium heart healthy        08/09/22 1402           Cardiac diet DISCHARGE MEDICATION: Allergies as of 08/10/2022   No Known Allergies      Medication List     STOP taking these medications    HYDROcodone-acetaminophen 5-325 MG tablet Commonly known as: Norco   ibuprofen 800 MG tablet Commonly known as: ADVIL  TAKE these medications    aspirin 81 MG chewable tablet Chew 1 tablet (81 mg total) by mouth daily.   atorvastatin 80 MG tablet Commonly known as: LIPITOR Take 1 tablet (80 mg total) by mouth daily.   losartan 25 MG tablet Commonly known as: COZAAR Take 1 tablet (25 mg total) by mouth daily.   multivitamin with minerals Tabs tablet Take 1 tablet by mouth daily.   pantoprazole 40 MG tablet Commonly known as: Protonix Take 1 tablet (40 mg total) by mouth  daily.   prasugrel 10 MG Tabs tablet Commonly known as: EFFIENT Take 1 tablet (10 mg total) by mouth daily.        Discharge Exam: Filed Weights   08/06/22 0500 08/09/22 0424  Weight: 59.1 kg 48.3 kg   Condition at discharge: fair  The results of significant diagnostics from this hospitalization (including imaging, microbiology, ancillary and laboratory) are listed below for reference.   Imaging Studies: ECHOCARDIOGRAM COMPLETE  Result Date: 08/06/2022    ECHOCARDIOGRAM REPORT   Patient Name:   Craig Reid Date of Exam: 08/06/2022 Medical Rec #:  [id number]            Height:       63.0 in Accession #:    [id number]           Weight:       130.3 lb Date of Birth:  04/09/1961             BSA:          1.612 m Patient Age:    62 years             BP:           114/68 mmHg Patient Gender: M                    HR:           125 bpm. Exam Location:  [facility] Procedure: 2D Echo, Cardiac Doppler and Color Doppler Indications:     Acute myocardial infarction I21.9  History:         Patient has no prior history of Echocardiogram examinations. No                  past medical history on file.  Sonographer:     Sherrie Sport Referring Phys:  [id number] A ARIDA Diagnosing Phys: Kathlyn Sacramento MD IMPRESSIONS  1. Left ventricular ejection fraction, by estimation, is 40 to 45%. The left ventricle has mildly decreased function. The left ventricle demonstrates global hypokinesis. Left ventricular diastolic parameters are indeterminate.  2. Right ventricular systolic function is normal. The right ventricular size is normal. There is normal pulmonary artery systolic pressure.  3. The mitral valve is normal in structure. Mild mitral valve regurgitation. No evidence of mitral stenosis.  4. The aortic valve is normal in structure. Aortic valve regurgitation is trivial. Aortic valve sclerosis is present, with no evidence of aortic valve stenosis. FINDINGS  Left Ventricle: Left ventricular ejection fraction, by  estimation, is 40 to 45%. The left ventricle has mildly decreased function. The left ventricle demonstrates global hypokinesis. The left ventricular internal cavity size was normal in size. There is  borderline left ventricular hypertrophy. Left ventricular diastolic parameters are indeterminate. Right Ventricle: The right ventricular size is normal. No increase in right ventricular wall thickness. Right ventricular systolic function is normal. There is normal pulmonary artery systolic pressure. The tricuspid regurgitant velocity is 2.44 m/s, and  with an assumed right atrial pressure of 5 mmHg, the estimated right ventricular systolic pressure is 0000000 mmHg. Left Atrium: Left atrial size was normal in size. Right Atrium: Right atrial size was normal in size. Pericardium: There is no evidence of pericardial effusion. Mitral Valve: The mitral valve is normal in structure. Mild mitral valve regurgitation. No evidence of mitral valve stenosis. Tricuspid Valve: The tricuspid valve is normal in structure. Tricuspid valve regurgitation is mild . No evidence of tricuspid stenosis. Aortic Valve: The aortic valve is normal in structure. Aortic valve regurgitation is trivial. Aortic valve sclerosis is present, with no evidence of aortic valve stenosis. Aortic valve mean gradient measures 2.0 mmHg. Aortic valve peak gradient measures 3.2 mmHg. Aortic valve area, by VTI measures 3.39 cm. Pulmonic Valve: The pulmonic valve was normal in structure. Pulmonic valve regurgitation is trivial. No evidence of pulmonic stenosis. Aorta: The aortic root is normal in size and structure. Venous: The inferior vena cava was not well visualized. IAS/Shunts: No atrial level shunt detected by color flow Doppler.  LEFT VENTRICLE PLAX 2D LVIDd:         4.50 cm LVIDs:         3.20 cm LV PW:         1.10 cm LV IVS:        1.10 cm LVOT diam:     2.10 cm LV SV:         48 LV SV Index:   30 LVOT Area:     3.46 cm  RIGHT VENTRICLE RV Basal diam:  4.60  cm RV Mid diam:    3.80 cm RV S prime:     13.10 cm/s TAPSE (M-mode): 2.8 cm LEFT ATRIUM             Index        RIGHT ATRIUM           Index LA diam:        2.70 cm 1.68 cm/m   RA Area:     17.00 cm LA Vol (A2C):   66.6 ml 41.32 ml/m  RA Volume:   49.40 ml  30.65 ml/m LA Vol (A4C):   28.5 ml 17.68 ml/m LA Biplane Vol: 42.2 ml 26.18 ml/m  AORTIC VALVE AV Area (Vmax):    2.55 cm AV Area (Vmean):   2.23 cm AV Area (VTI):     3.39 cm AV Vmax:           89.90 cm/s AV Vmean:          66.000 cm/s AV VTI:            0.141 m AV Peak Grad:      3.2 mmHg AV Mean Grad:      2.0 mmHg LVOT Vmax:         66.30 cm/s LVOT Vmean:        42.500 cm/s LVOT VTI:          0.138 m LVOT/AV VTI ratio: 0.98  AORTA Ao Root diam: 3.30 cm MITRAL VALVE               TRICUSPID VALVE MV Area (PHT): 4.73 cm    TR Peak grad:   23.8 mmHg MV Decel Time: 161 msec    TR Vmax:        244.00 cm/s MV E velocity: 73.90 cm/s MV A velocity: 32.50 cm/s  SHUNTS MV E/A ratio:  2.27        Systemic VTI:  0.14 m                            Systemic Diam: 2.10 cm Kathlyn Sacramento MD Electronically signed by Kathlyn Sacramento MD Signature Date/Time: 08/06/2022/1:37:33 PM    Final    Portable chest 1 View  Result Date: 08/06/2022 CLINICAL DATA:  Coronary artery disease. EXAM: PORTABLE CHEST 1 VIEW COMPARISON:  04/21/2019 FINDINGS: Single-view of the chest was obtained. Cardiac pads overlying the chest. Negative for a pneumothorax. No focal airspace disease and no overt pulmonary edema. Heart size is normal. The trachea is midline. Bony thorax is grossly intact. IMPRESSION: No focal chest disease. Electronically Signed   By: Markus Daft M.D.   On: 08/06/2022 07:47   US Venous Img Lower Bilateral (DVT)  Result Date: 08/06/2022 CLINICAL DATA:  62 year old male with lower extremity swelling. EXAM: BILATERAL LOWER EXTREMITY VENOUS DOPPLER ULTRASOUND TECHNIQUE: Gray-scale sonography with graded compression, as well as color Doppler and duplex ultrasound were  performed to evaluate the lower extremity deep venous systems from the level of the common femoral vein and including the common femoral, femoral, profunda femoral, popliteal and calf veins including the posterior tibial, peroneal and gastrocnemius veins when visible. The superficial great saphenous vein was also interrogated. Spectral Doppler was utilized to evaluate flow at rest and with distal augmentation maneuvers in the common femoral, femoral and popliteal veins. COMPARISON:  None Available. FINDINGS: RIGHT LOWER EXTREMITY Common Femoral Vein: No evidence of thrombus. Normal compressibility, respiratory phasicity and response to augmentation. Saphenofemoral Junction: Limited visualization due to overlying bandage. Profunda Femoral Vein: No evidence of thrombus. Normal compressibility and flow on color Doppler imaging. Femoral Vein: No evidence of thrombus. Normal compressibility, respiratory phasicity and response to augmentation. Popliteal Vein: No evidence of thrombus. Normal compressibility, respiratory phasicity and response to augmentation. Calf Veins: No evidence of thrombus. Normal compressibility and flow on color Doppler imaging. Other Findings:  None. LEFT LOWER EXTREMITY Common Femoral Vein: No evidence of thrombus. Normal compressibility, respiratory phasicity and response to augmentation. Saphenofemoral Junction: Limited visualization due to overlying bandage. Profunda Femoral Vein: No evidence of thrombus. Normal compressibility and flow on color Doppler imaging. Femoral Vein: No evidence of thrombus. Normal compressibility, respiratory phasicity and response to augmentation. Popliteal Vein: No evidence of thrombus. Normal compressibility, respiratory phasicity and response to augmentation. Calf Veins: No evidence of thrombus. Normal compressibility and flow on color Doppler imaging. Other Findings:  None. IMPRESSION: No evidence of bilateral lower extremity deep venous thrombosis. Limited  visualization of the bilateral saphenofemoral junctions due to overlying bandages. Ruthann Cancer, MD [facility] Electronically Signed   By: Ruthann Cancer M.D.   On: 08/06/2022 07:43   CARDIAC CATHETERIZATION  Result Date: 08/05/2022   Prox RCA lesion is 40% stenosed.   Ost LM to Mid LM lesion is 95% stenosed.   A drug-eluting stent was successfully placed using a STENT ONYX FRONTIER 3.5X12.   Post intervention, there is a 0% residual stenosis.   There is severe left ventricular systolic dysfunction.   LV end diastolic pressure is mildly elevated.   The left ventricular ejection fraction is less than 25% by visual estimate. 1.  Severe ostial left main stenosis with hazy appearance highly suggestive of acute plaque rupture.  No other obstructive disease. 2.  Severely reduced LV systolic function with an EF of 20% with akinesia of the mid to distal anterior, apical and infero-Apical myocardium. 3.  Successful angioplasty and drug-eluting stent placement to the ostial left main coronary artery. Recommendations: Dual antiplatelet therapy for at least 12 months. No beta-blockers for now due to bradycardia.  Monitor heart rate and blood pressure closely as the patient is at risk for decompensation given degree of myocardium at risk. Delays and door to device time related to language barrier and the difficulty of obtaining an interpreter.    Microbiology: Results for orders placed or performed during the hospital encounter of 04/21/19  SARS Coronavirus 2 by RT PCR (hospital order, performed in [facility] hospital lab) Nasopharyngeal Nasopharyngeal Swab     Status: None   Collection Time: 04/21/19  8:16 PM   Specimen: Nasopharyngeal Swab  Result Value Ref Range Status   SARS Coronavirus 2 NEGATIVE NEGATIVE Final    Comment: (NOTE) If result is NEGATIVE SARS-CoV-2 target nucleic acids are NOT DETECTED. The SARS-CoV-2 RNA is generally detectable in  upper and lower  respiratory specimens during the acute phase of infection. The lowest  concentration of SARS-CoV-2 viral copies this assay can detect is 250  copies / mL. A negative result does not preclude SARS-CoV-2 infection  and should not be used as the sole basis for treatment or other  patient management decisions.  A negative result may occur with  improper specimen collection / handling, submission of specimen other  than nasopharyngeal swab, presence of viral mutation(s) within the  areas targeted by this assay, and inadequate number of viral copies  (<250 copies / mL). A negative result must be combined with clinical  observations, patient history, and epidemiological information. If result is POSITIVE SARS-CoV-2 target nucleic acids are DETECTED. The SARS-CoV-2 RNA is generally detectable in upper and lower  respiratory specimens dur ing the acute phase of infection.  Positive  results are indicative of active infection with SARS-CoV-2.  Clinical  correlation with patient history and other diagnostic information is  necessary to determine patient infection status.  Positive results do  not rule out bacterial infection or co-infection with other viruses. If result is PRESUMPTIVE POSTIVE SARS-CoV-2 nucleic acids MAY BE PRESENT.   A presumptive positive result was obtained on the submitted specimen  and confirmed on repeat testing.  While 2019 novel coronavirus  (SARS-CoV-2) nucleic acids may be present in the submitted sample  additional confirmatory testing may be necessary for epidemiological  and / or clinical management purposes  to differentiate between  SARS-CoV-2 and other Sarbecovirus currently known to infect humans.  If clinically indicated additional testing with an alternate test  methodology [phone]) is advised. The SARS-CoV-2 RNA is generally  detectable in upper and lower respiratory sp ecimens during the acute  phase of infection. The expected result is  Negative. Fact Sheet for Patients:  StrictlyIdeas.no Fact Sheet for Healthcare Providers: BankingDealers.co.za This test is not yet approved or cleared by the [place] FDA and has been authorized for detection and/or diagnosis of SARS-CoV-2 by FDA under an Emergency Use Authorization (EUA).  This EUA will remain in effect (meaning this test can be used) for the duration of the COVID-19 declaration under Section 564(b)(1) of the Act, 21 U.S.C. section 360bbb-3(b)(1), unless the authorization is terminated or revoked sooner. Performed at [facility], [address] [place], [place] [address]     Labs: CBC: Recent Labs  Lab 08/05/22 2145 08/06/22 0506 08/07/22 0328 08/08/22 0231 08/09/22 0302 08/10/22 0309  WBC 7.4 8.4 6.6 8.6 8.2 8.5  NEUTROABS 4.0  --   --   --   --   --  HGB 13.9 13.5 13.7 15.1 15.2 15.4  HCT 43.8 41.0 43.2 45.6 45.8 46.9  MCV 98.0 94.7 97.7 95.6 94.2 93.2  PLT 214 208 201 224 247 [id number]   Basic Metabolic Panel: Recent Labs  Lab 08/06/22 0504 08/06/22 0506 08/07/22 0328 08/08/22 0231 08/09/22 0302 08/10/22 0309  NA  --  140 134* 137 137 135  K  --  3.5 3.7 3.4* 4.0 3.6  CL  --  109 108 106 107 102  CO2  --  '23 22 22 '$ 21* 23  GLUCOSE  --  107* 110* 92 92 93  BUN  --  17 18 21* 29* 27*  CREATININE  --  0.75 0.91 0.87 0.88 0.81  CALCIUM  --  9.1 8.8* 9.1 9.5 9.3  MG 2.1  --  2.0 2.1 2.1 2.1  PHOS 3.2  --  3.1 3.7 3.9 4.0   Liver Function Tests: Recent Labs  Lab 08/05/22 2145 08/06/22 0506 08/07/22 0328  AST 108* 79* 43*  ALT 68* 76* 56*  ALKPHOS 69 69 61  BILITOT 0.8 1.2 1.4*  PROT 7.4 7.6 7.1  ALBUMIN 3.6 3.8 3.4*   CBG: Recent Labs  Lab 08/09/22 0743 08/09/22 1135 08/09/22 1538 08/09/22 2051 08/10/22 0744  GLUCAP 108* 120* 92 90 95    Discharge time spent: greater than 30 minutes.  Signed: Lucienne Minks , MD [facility] 08/10/2022

## 2022-08-11 ENCOUNTER — Telehealth: Payer: Self-pay | Admitting: Cardiology

## 2022-08-11 NOTE — Telephone Encounter (Signed)
Notified by [employer] that patient had 13 seconds of complete heart block this afternoon at 3:44 PM. HR was between 38-40 BPM. No additional episodes of complete heart block noted today.   I attempted to call patient twice at his home phone number, but he did not answer. I called his Niece at the number listed in the chart, but she did not answer either.   I will send this note to Dr. Fletcher Anon (patient's primary cardiologist) as an Juluis Rainier.   Margie Billet, PA-C 08/11/2022 5:17 PM

## 2022-08-11 NOTE — Telephone Encounter (Addendum)
Brief Telephone Encounter:  Received call from [employer] at 7:37 PM that patient just had complete heart block. Heart rate during event was 38-42 BPM. Duration of CHB was 8.3 seconds. Underlying rhythm was mobitz 1 underlying with baseline HR 40-50bpm.  Attempted to call patient at listed number [phone]). Unfortunately, this is not the correct number for the patient.   I then attempted to call only available contact (Niece, [phone]), but call was not answered despite several attempts.   Per chart review, patient had similar episode (13 seconds CHB) earlier this afternoon. Patient and listed contact could also not be reached at that time. Patient's primary cardiologist has been messaged regarding CHB.

## 2022-08-12 ENCOUNTER — Telehealth: Payer: Self-pay | Admitting: Cardiology

## 2022-08-12 NOTE — Telephone Encounter (Signed)
Notified by [employer] that patient had an episode of CHB this morning that lasted 49.8 seconds. HR ranged from 33-44 BPM.   Overnight Fellow (Dr. Grayce Sessions) discovered that the patient's home phone number listed in the chart ([phone]) does not belong to the patient. Dr. Grayce Sessions spoke to the woman at that number, and she did not have any relationship with the patient and did not know who he was.   I attempted to call patient's niece at the number listed in the chart, and she did not answer. Several attempts were made to contact the niece overnight without success.   I asked [employer] if they had any contact information for the patient, and they do not have a personal phone number for the patient. They only have the niece's contact information, and the niece has not answered any of their calls. I also contacted Gerrie Nordmann NP, who ordered the monitor, to see if she had any additional contact information for the patient. She has also been attempting to reach the patient and niece without success. If we can get a hold of the patient, recommend getting him an office appointment early this week.   Margie Billet, PA-C 08/12/2022 8:45 AM

## 2022-08-13 ENCOUNTER — Telehealth: Payer: Self-pay | Admitting: Cardiology

## 2022-08-13 ENCOUNTER — Telehealth: Payer: Self-pay | Admitting: Cardiovascular Disease

## 2022-08-13 NOTE — Telephone Encounter (Signed)
Called [employer] back. They stated that the patient  has High Grade AV block. This call is a duplicate to the one from the yesterday, 3/10. Please note other calls concering this patient. Attempts ae being made to call him.

## 2022-08-13 NOTE — Telephone Encounter (Signed)
[employer] by Theodore Demark is calling to give abnormal results.

## 2022-08-13 NOTE — Telephone Encounter (Signed)
Multiple calls received over the weekend from [facility] related to patient's ZIO AT monitor with varying heart rates and rhythms from atrial fibrillation to complete heart block. Several staff members attempted to get in contact with the patient and his niece, multiple times throughout the weekend, with all attempts being unsuccessful.  We were able to get in contact with the patient's niece this morning and she stated that the patient had been having several alarms from his Apple watch over the weekend for low heart rates but he remained asymptomatic.  An appointment was made for him to follow-up with  EP later on this week in [place].

## 2022-08-13 NOTE — Telephone Encounter (Addendum)
Spoke with Zigmund Daniel and reviewed that we had made several attempts to reach her in regards to patients heart monitor. Discussed the need for appointment and was able to schedule with Dr. Quentin Ore. She said that patient had been getting several alerts on his apple watch indicating low heart rates. Confirmed appointment for this week and she verbalized understanding with no further questions at this time.

## 2022-08-13 NOTE — Telephone Encounter (Signed)
See other phone note where appointment was confirmed.

## 2022-08-13 NOTE — Telephone Encounter (Signed)
That is fine.  Just make sure he keeps his appointment with Dr. Quentin Ore on the 13th.

## 2022-08-13 NOTE — Telephone Encounter (Signed)
Incoming call from [facility]. Call was disconnected before they could give the message.

## 2022-08-13 NOTE — Telephone Encounter (Signed)
Left voicemail message to call back  

## 2022-08-14 ENCOUNTER — Encounter: Payer: Self-pay | Admitting: *Deleted

## 2022-08-14 NOTE — H&P (View-Only) (Signed)
Electrophysiology Office Follow up Visit Note:    Date:  08/15/2022   ID:  Craig Reid, DOB 11/14/1960, MRN [id number]  PCP:  Anderson, Shane D, FNP  [facility] Cardiologist:  Muhammad Arida, MD  [facility] Electrophysiologist:  Everard Interrante T Careem Yasui, MD    Interval History:    Craig Reid is a 61 y.o. male who presents for a follow up visit.   The patient has severe coronary artery disease and recently underwent stent placement to a left main ruptured plaque.  The patient had high degree AV block initially upon arrival but this improved after stenting of the left main.  I saw the patient on August 08, 2022 at that time his conduction was intact with Wenckebach physiology observed on telemetry.  He was discharged from the hospital wearing a ZIO monitor.  Several alerts have come through for possible high degree AV block.    Today the patient is with his sister in clinic.  The patient reports feeling chest squeezing and tightness in the nighttime hours.  During the daytime he feels relatively well.  No syncope or presyncope.  He has been taking his medications as prescribed.       Past medical, surgical, social and family history were reviewed.  ROS:   Please see the history of present illness.    All other systems reviewed and are negative.  EKGs/Labs/Other Studies Reviewed:    The following studies were reviewed today:  ZIO AT monitor (still in place) reports reviewed personally.  Shows episodes of complete heart block with escape rhythms in the low 30s (33 bpm).  These episodes are recurrent.     Physical Exam:    VS:  BP 110/64   Pulse 87   Ht 5' 2" (1.575 m)   Wt 110 lb (49.9 kg)   SpO2 95%   BMI 20.12 kg/m     Wt Readings from Last 3 Encounters:  08/15/22 110 lb (49.9 kg)  08/09/22 106 lb 7.7 oz (48.3 kg)  04/21/19 110 lb (49.9 kg)     GEN:  Well nourished, well developed in no acute distress CARDIAC: RRR, no murmurs, rubs,  gallops RESPIRATORY:  Clear to auscultation without rales, wheezing or rhonchi       ASSESSMENT:    1. Syncope and collapse   2. Coronary artery disease involving native coronary artery of native heart without angina pectoris   3. Atrioventricular block, Mobitz type 1, Wenckebach    PLAN:    In order of problems listed above:  #Syncope #High degree AV block Patient presented with high degree AV block in the setting of severe coronary artery disease and a ruptured plaque in the left main that is now been stented.  His conduction progressively improved during his recent hospitalization and he was discharged wearing a ZIO monitor.  After hospital discharge, the patient has had recurrent complete heart block mostly in the evenings.  These episodes are symptomatic with chest tightness and squeezing.  I think a permanent pacemaker is indicated.  Given his reduced ejection fraction and anticipated high pacing burden, plan for CRT-P versus dual-chamber with left bundle area lead.  Procedure will be performed by Dr. Klein this Friday.  He will continue his DAPT given recent left main stent.  Risks, benefits, alternatives to CRT implantation were discussed in detail with the patient today. The patient understands that the risks include but are not limited to bleeding, infection, pneumothorax, perforation, tamponade, vascular damage, renal failure, MI, stroke, death, and   lead dislodgement and wishes to proceed.  We will therefore schedule device implantation at the next available time.    Total time of encounter: 42 minutes total time of encounter, including face-to-face patient care, coordination of care and counseling regarding high complexity medical decision making re: CHB.          Signed, Sansa Alkema, MD, FACC, FHRS 08/15/2022 11:30 AM    Electrophysiology [facility] 

## 2022-08-14 NOTE — Progress Notes (Unsigned)
Electrophysiology Office Follow up Visit Note:    Date:  08/15/2022   ID:  Craig Reid, DOB 03/28/1961, MRN [id number]  PCP:  Gregor Hams, FNP  [facility] Cardiologist:  Kathlyn Sacramento, MD  [facility] Electrophysiologist:  Vickie Epley, MD    Interval History:    Craig Reid is a 62 y.o. male who presents for a follow up visit.   The patient has severe coronary artery disease and recently underwent stent placement to a left main ruptured plaque.  The patient had high degree AV block initially upon arrival but this improved after stenting of the left main.  I saw the patient on August 08, 2022 at that time his conduction was intact with Wenckebach physiology observed on telemetry.  He was discharged from the hospital wearing a ZIO monitor.  Several alerts have come through for possible high degree AV block.    Today the patient is with his sister in clinic.  The patient reports feeling chest squeezing and tightness in the nighttime hours.  During the daytime he feels relatively well.  No syncope or presyncope.  He has been taking his medications as prescribed.       Past medical, surgical, social and family history were reviewed.  ROS:   Please see the history of present illness.    All other systems reviewed and are negative.  EKGs/Labs/Other Studies Reviewed:    The following studies were reviewed today:  ZIO AT monitor (still in place) reports reviewed personally.  Shows episodes of complete heart block with escape rhythms in the low 30s (33 bpm).  These episodes are recurrent.     Physical Exam:    VS:  BP 110/64   Pulse 87   Ht '5\' 2"'$  (1.575 m)   Wt 110 lb (49.9 kg)   SpO2 95%   BMI 20.12 kg/m     Wt Readings from Last 3 Encounters:  08/15/22 110 lb (49.9 kg)  08/09/22 106 lb 7.7 oz (48.3 kg)  04/21/19 110 lb (49.9 kg)     GEN:  Well nourished, well developed in no acute distress CARDIAC: RRR, no murmurs, rubs,  gallops RESPIRATORY:  Clear to auscultation without rales, wheezing or rhonchi       ASSESSMENT:    1. Syncope and collapse   2. Coronary artery disease involving native coronary artery of native heart without angina pectoris   3. Atrioventricular block, Mobitz type 1, Wenckebach    PLAN:    In order of problems listed above:  #Syncope #High degree AV block Patient presented with high degree AV block in the setting of severe coronary artery disease and a ruptured plaque in the left main that is now been stented.  His conduction progressively improved during his recent hospitalization and he was discharged wearing a ZIO monitor.  After hospital discharge, the patient has had recurrent complete heart block mostly in the evenings.  These episodes are symptomatic with chest tightness and squeezing.  I think a permanent pacemaker is indicated.  Given his reduced ejection fraction and anticipated high pacing burden, plan for CRT-P versus dual-chamber with left bundle area lead.  Procedure will be performed by Dr. Caryl Comes this Friday.  He will continue his DAPT given recent left main stent.  Risks, benefits, alternatives to CRT implantation were discussed in detail with the patient today. The patient understands that the risks include but are not limited to bleeding, infection, pneumothorax, perforation, tamponade, vascular damage, renal failure, MI, stroke, death, and  lead dislodgement and wishes to proceed.  We will therefore schedule device implantation at the next available time.    Total time of encounter: 42 minutes total time of encounter, including face-to-face patient care, coordination of care and counseling regarding high complexity medical decision making re: CHB.          Signed, Lars Mage, MD, [facility], [facility] 08/15/2022 11:30 AM    Electrophysiology [facility]

## 2022-08-15 ENCOUNTER — Encounter: Payer: Self-pay | Admitting: Cardiology

## 2022-08-15 ENCOUNTER — Ambulatory Visit: Payer: 59 | Attending: Cardiology | Admitting: Cardiology

## 2022-08-15 ENCOUNTER — Other Ambulatory Visit
Admission: RE | Admit: 2022-08-15 | Discharge: 2022-08-15 | Disposition: A | Discharge status: Home / Self Care | Payer: 59 | Source: Ambulatory Visit | Attending: Cardiology | Admitting: Cardiology

## 2022-08-15 VITALS — BP 110/64 | HR 87 | Ht 62.0 in | Wt 110.0 lb

## 2022-08-15 DIAGNOSIS — R55 Syncope and collapse: Secondary | ICD-10-CM | POA: Insufficient documentation

## 2022-08-15 DIAGNOSIS — I441 Atrioventricular block, second degree: Secondary | ICD-10-CM

## 2022-08-15 DIAGNOSIS — I2510 Atherosclerotic heart disease of native coronary artery without angina pectoris: Secondary | ICD-10-CM

## 2022-08-15 LAB — CBC WITH DIFFERENTIAL/PLATELET
Abs Immature Granulocytes: 0.01 10*3/uL (ref 0.00–0.07)
Basophils Absolute: 0.1 10*3/uL (ref 0.0–0.1)
Basophils Relative: 1 %
Eosinophils Absolute: 0.2 10*3/uL (ref 0.0–0.5)
Eosinophils Relative: 2 %
HCT: 43.6 % (ref 39.0–52.0)
Hemoglobin: 14.3 g/dL (ref 13.0–17.0)
Immature Granulocytes: 0 %
Lymphocytes Relative: 18 %
Lymphs Abs: 1.4 10*3/uL (ref 0.7–4.0)
MCH: 31 pg (ref 26.0–34.0)
MCHC: 32.8 g/dL (ref 30.0–36.0)
MCV: 94.6 fL (ref 80.0–100.0)
Monocytes Absolute: 0.5 10*3/uL (ref 0.1–1.0)
Monocytes Relative: 7 %
Neutro Abs: 5.8 10*3/uL (ref 1.7–7.7)
Neutrophils Relative %: 72 %
Platelets: 265 10*3/uL (ref 150–400)
RBC: 4.61 MIL/uL (ref 4.22–5.81)
RDW: 11.3 % — ABNORMAL LOW (ref 11.5–15.5)
WBC: 8 10*3/uL (ref 4.0–10.5)
nRBC: 0 % (ref 0.0–0.2)

## 2022-08-15 LAB — CYTOCHROME P450 2C19: 2C19 Metabolic Activity:: NORMAL

## 2022-08-15 LAB — BASIC METABOLIC PANEL
Anion gap: 5 (ref 5–15)
BUN: 26 mg/dL — ABNORMAL HIGH (ref 6–20)
CO2: 25 mmol/L (ref 22–32)
Calcium: 9.5 mg/dL (ref 8.9–10.3)
Chloride: 105 mmol/L (ref 98–111)
Creatinine, Ser: 0.87 mg/dL (ref 0.61–1.24)
GFR, Estimated: >60 mL/min (ref >60)
Glucose, Bld: 96 mg/dL (ref 70–99)
Potassium: 3.6 mmol/L (ref 3.5–5.1)
Sodium: 135 mmol/L (ref 135–145)

## 2022-08-15 NOTE — Patient Instructions (Addendum)
Medication Instructions:  Your physician recommends that you continue on your current medications as directed. Please refer to the Current Medication list given to you today.  *If you need a refill on your cardiac medications before your next appointment, please call your pharmacy*  Lab Work: TODAY: BM[facility] and [place] will get your lab work at [facility] hospital.  Your lab work will be done at the [place] next to [employer]. These are walk in labs- you will not need an appointment and you do not need to be fasting.    Testing/Procedures: Your physician has recommended that you have a pacemaker inserted. A pacemaker is a small device that is placed under the skin of your chest or abdomen to help control abnormal heart rhythms. This device uses electrical pulses to prompt the heart to beat at a normal rate. Pacemakers are used to treat heart rhythms that are too slow. Wire (leads) are attached to the pacemaker that goes into the chambers of you heart. This is done in the hospital and usually requires and overnight stay. Please see the instruction sheet given to you today for more information.  Follow-Up: At [facility], you and your health needs are our priority.  As part of our continuing mission to provide you with exceptional heart care, we have created designated Provider Care Teams.  These Care Teams include your primary Cardiologist (physician) and Advanced Practice Providers (APPs -  Physician Assistants and Nurse Practitioners) who all work together to provide you with the care you need, when you need it.  Your next appointment:   We will call you to arrange your follow up appointment.

## 2022-08-15 NOTE — Progress Notes (Signed)
He has an appointment with EP this week. These strips are unchanged from the earlier ones called over the weekend.

## 2022-08-16 NOTE — Pre-Procedure Instructions (Signed)
Was going to call patient regarding procedure instructions.  No phone # listed in EPIC for patient.  Attempted to call Niece, no answer.

## 2022-08-17 ENCOUNTER — Ambulatory Visit (HOSPITAL_COMMUNITY): Payer: 59

## 2022-08-17 ENCOUNTER — Encounter (HOSPITAL_COMMUNITY)
Admission: RE | Disposition: A | Discharge status: Home / Self Care | Payer: Self-pay | Source: Home / Self Care | Attending: Internal Medicine

## 2022-08-17 ENCOUNTER — Other Ambulatory Visit: Payer: Self-pay

## 2022-08-17 ENCOUNTER — Encounter: Payer: No Typology Code available for payment source | Admitting: Family

## 2022-08-17 ENCOUNTER — Ambulatory Visit (HOSPITAL_COMMUNITY)
Admission: RE | Admit: 2022-08-17 | Discharge: 2022-08-17 | Disposition: A | Discharge status: Home / Self Care | Payer: 59 | Attending: Internal Medicine | Admitting: Internal Medicine

## 2022-08-17 DIAGNOSIS — Z950 Presence of cardiac pacemaker: Secondary | ICD-10-CM | POA: Diagnosis not present

## 2022-08-17 DIAGNOSIS — Z955 Presence of coronary angioplasty implant and graft: Secondary | ICD-10-CM | POA: Diagnosis not present

## 2022-08-17 DIAGNOSIS — R55 Syncope and collapse: Secondary | ICD-10-CM | POA: Diagnosis not present

## 2022-08-17 DIAGNOSIS — I2510 Atherosclerotic heart disease of native coronary artery without angina pectoris: Secondary | ICD-10-CM | POA: Insufficient documentation

## 2022-08-17 DIAGNOSIS — I441 Atrioventricular block, second degree: Secondary | ICD-10-CM | POA: Diagnosis not present

## 2022-08-17 DIAGNOSIS — I442 Atrioventricular block, complete: Secondary | ICD-10-CM | POA: Diagnosis not present

## 2022-08-17 HISTORY — PX: BIV PACEMAKER INSERTION CRT-P: EP1199

## 2022-08-17 SURGERY — BIV PACEMAKER INSERTION CRT-P

## 2022-08-17 MED ORDER — CEFAZOLIN SODIUM-DEXTROSE 2-4 GM/100ML-% IV SOLN
2.0000 g | INTRAVENOUS | Status: AC
  Administered 2022-08-17: 2 g via INTRAVENOUS

## 2022-08-17 MED ORDER — POVIDONE-IODINE 10 % EX SWAB
2.0000 | Freq: Once | CUTANEOUS | Status: AC
  Administered 2022-08-17: 2 via TOPICAL

## 2022-08-17 MED ORDER — CEFAZOLIN SODIUM-DEXTROSE 2-4 GM/100ML-% IV SOLN
INTRAVENOUS | Status: AC
  Filled 2022-08-17: qty 100

## 2022-08-17 MED ORDER — LIDOCAINE HCL (PF) 1 % IJ SOLN
INTRAMUSCULAR | Status: AC
  Filled 2022-08-17: qty 30

## 2022-08-17 MED ORDER — ACETAMINOPHEN 325 MG PO TABS
325.0000 mg | ORAL_TABLET | ORAL | Status: DC | PRN
  Administered 2022-08-17: 650 mg via ORAL
  Filled 2022-08-17: qty 2

## 2022-08-17 MED ORDER — LIDOCAINE HCL (PF) 1 % IJ SOLN
INTRAMUSCULAR | Status: DC | PRN
  Administered 2022-08-17: 50 mL

## 2022-08-17 MED ORDER — MIDAZOLAM HCL 5 MG/5ML IJ SOLN
INTRAMUSCULAR | Status: DC | PRN
  Administered 2022-08-17 (×2): 1 mg via INTRAVENOUS
  Administered 2022-08-17: 2 mg via INTRAVENOUS

## 2022-08-17 MED ORDER — SODIUM CHLORIDE 0.9 % IV SOLN
80.0000 mg | INTRAVENOUS | Status: AC
  Administered 2022-08-17: 80 mg

## 2022-08-17 MED ORDER — FENTANYL CITRATE (PF) 100 MCG/2ML IJ SOLN
INTRAMUSCULAR | Status: DC | PRN
  Administered 2022-08-17 (×3): 25 ug via INTRAVENOUS

## 2022-08-17 MED ORDER — MIDAZOLAM HCL 5 MG/5ML IJ SOLN
INTRAMUSCULAR | Status: AC
  Filled 2022-08-17: qty 5

## 2022-08-17 MED ORDER — SODIUM CHLORIDE 0.9 % IV SOLN: INTRAVENOUS | Status: DC

## 2022-08-17 MED ORDER — FENTANYL CITRATE (PF) 100 MCG/2ML IJ SOLN
INTRAMUSCULAR | Status: AC
  Filled 2022-08-17: qty 2

## 2022-08-17 MED ORDER — ONDANSETRON HCL 4 MG/2ML IJ SOLN: 4.0000 mg | Freq: Four times a day (QID) | INTRAMUSCULAR | Status: DC | PRN

## 2022-08-17 MED ORDER — HEPARIN (PORCINE) IN NACL 1000-0.9 UT/500ML-% IV SOLN
INTRAVENOUS | Status: DC | PRN
  Administered 2022-08-17: 500 mL

## 2022-08-17 MED ORDER — SODIUM CHLORIDE 0.9 % IV SOLN
INTRAVENOUS | Status: AC
  Filled 2022-08-17: qty 2

## 2022-08-17 SURGICAL SUPPLY — 15 items
CABLE SURGICAL S-101-97-12 (CABLE) ×1 IMPLANT
CATH RIGHTSITE C315HIS02 (CATHETERS) IMPLANT
HEMOSTAT SURGICEL 2X4 FIBR (HEMOSTASIS) IMPLANT
LEAD CAPSURE NOVUS 45CM (Lead) IMPLANT
LEAD CAPSURE NOVUS 5076-52CM (Lead) IMPLANT
LEAD SELECT SECURE 3830 383069 (Lead) IMPLANT
PACEMAKER PRCT MRI CRTP W1TR01 (Pacemaker) IMPLANT
PAD DEFIB RADIO PHYSIO CONN (PAD) ×1 IMPLANT
PPM PRECEPTA MRI CRT-P W1TR01 (Pacemaker) ×1 IMPLANT
SELECT SECURE 3830 383069 (Lead) ×1 IMPLANT
SHEATH 7FR PRELUDE SNAP 13 (SHEATH) IMPLANT
SHEATH 9FR PRELUDE SNAP 13 (SHEATH) IMPLANT
SLITTER 6232ADJ (MISCELLANEOUS) IMPLANT
TRAY PACEMAKER INSERTION (PACKS) ×1 IMPLANT
WIRE HI TORQ VERSACORE-J 145CM (WIRE) IMPLANT

## 2022-08-17 NOTE — Interval H&P Note (Signed)
History and Physical Interval Note:  08/17/2022 7:36 AM  Craig Reid  has presented today for surgery, with the diagnosis of heart block.  The various methods of treatment have been discussed with the patient and family. After consideration of risks, benefits and other options for treatment, the patient has consented to  Procedure(s): BIV PACEMAKER INSERTION CRT-P (N/A) as a surgical intervention.  The patient's history has been reviewed, patient examined, no change in status, stable for surgery.  I have reviewed the patient's chart and labs.  Questions were answered to the patient's satisfaction.     Virl Axe  Reviewed with patient and his niece who is translating on his behalf.  Will attempt LBBA pacing as primary strategy

## 2022-08-17 NOTE — Discharge Instructions (Addendum)
Dermabond will come off in next 10-14 days; if not will remove at wound check  Keep wound dry until tomorrow evening No Driving x 4 daysAfter Your Pacemaker   You have a Medtronic Pacemaker  ACTIVITY Do not lift your arm above shoulder height for 1 week after your procedure. After 7 days, you may progress as below.  You should remove your sling 24 hours after your procedure, unless otherwise instructed by your provider.     Friday August 24, 2022  Saturday August 25, 2022 Sunday August 26, 2022 Monday August 27, 2022   Do not lift, push, pull, or carry anything over 10 pounds with the affected arm until 6 weeks (Friday September 28, 2022 ) after your procedure.   You may drive AFTER your wound check, unless you have been told otherwise by your provider.   Ask your healthcare provider when you can go back to work   INCISION/Dressing If you are on a blood thinner such as Coumadin, Xarelto, Eliquis, Plavix, or Pradaxa please confirm with your provider when this should be resumed.   If large square, outer bandage is left in place, this can be removed after 24 hours from your procedure. Do not remove steri-strips or glue as below.   If a PRESSURE DRESSING (a bulky dressing that usually goes up over your shoulder) was applied or left in place, DO NOT remove until post office visit.  Monitor your Pacemaker site for redness, swelling, and drainage. Call the device clinic at [phone] if you experience these symptoms or fever/chills.  If your incision is closed with Dermabond/Surgical glue. You may shower 1 day after your pacemaker implant and wash around the site with soap and water.    If you were discharged in a sling, please do not wear this during the day more than 48 hours after your surgery unless otherwise instructed. This may increase the risk of stiffness and soreness in your shoulder.   Avoid lotions, ointments, or perfumes over your incision until it is well-healed.  You may use a  hot tub or a pool AFTER your wound check appointment if the incision is completely closed.  Pacemaker Alerts:  Some alerts are vibratory and others beep. These are NOT emergencies. Please call our office to let us know. If this occurs at night or on weekends, it can wait until the next business day. Send a remote transmission.  If your device is capable of reading fluid status (for heart failure), you will be offered monthly monitoring to review this with you.   DEVICE MANAGEMENT Remote monitoring is used to monitor your pacemaker from home. This monitoring is scheduled every 91 days by our office. It allows [place] to keep an eye on the functioning of your device to ensure it is working properly. You will routinely see your Electrophysiologist annually (more often if necessary).   You should receive your ID card for your new device in 4-8 weeks. Keep this card with you at all times once received. Consider wearing a medical alert bracelet or necklace.  Your Pacemaker may be MRI compatible. This will be discussed at your next office visit/wound check.  You should avoid contact with strong electric or magnetic fields.   Do not use amateur (ham) radio equipment or electric (arc) welding torches. MP3 player headphones with magnets should not be used. Some devices are safe to use if held at least 12 inches (30 cm) from your Pacemaker. These include power tools, lawn mowers, and speakers. If  you are unsure if something is safe to use, ask your health care provider.  When using your cell phone, hold it to the ear that is on the opposite side from the Pacemaker. Do not leave your cell phone in a pocket over the Pacemaker.  You may safely use electric blankets, heating pads, computers, and microwave ovens.  Call the office right away if: You have chest pain. You feel more short of breath than you have felt before. You feel more light-headed than you have felt before. Your incision starts to open up.  This  information is not intended to replace advice given to you by your health care provider. Make sure you discuss any questions you have with your health care provider.

## 2022-08-20 ENCOUNTER — Encounter (HOSPITAL_COMMUNITY): Payer: Self-pay | Admitting: Internal Medicine

## 2022-08-21 ENCOUNTER — Encounter (HOSPITAL_COMMUNITY): Payer: Self-pay | Admitting: Internal Medicine

## 2022-08-21 ENCOUNTER — Ambulatory Visit: Payer: 59

## 2022-08-21 ENCOUNTER — Ambulatory Visit: Payer: 59 | Attending: Cardiology

## 2022-08-21 DIAGNOSIS — I420 Dilated cardiomyopathy: Secondary | ICD-10-CM

## 2022-08-21 DIAGNOSIS — I441 Atrioventricular block, second degree: Secondary | ICD-10-CM

## 2022-08-21 NOTE — Patient Instructions (Signed)
? ?  After Your Pacemaker ? ? ?Monitor your pacemaker site for redness, swelling, and drainage. Call the device clinic at [phone] if you experience these symptoms or fever/chills. ? ?

## 2022-08-21 NOTE — Progress Notes (Signed)
Patient seen in device clinic to remove pressure dressing. Pressure dressing removed and no swelling/redness or bleeding noted around incision area. Steristrips are present and dry. Advised patient to keep area clean and dry and call if any swelling, bleeding or pain. Pt voiced understanding.

## 2022-08-29 ENCOUNTER — Ambulatory Visit: Payer: 59 | Attending: Internal Medicine

## 2022-08-29 DIAGNOSIS — I441 Atrioventricular block, second degree: Secondary | ICD-10-CM

## 2022-08-29 LAB — CUP PACEART INCLINIC DEVICE CHECK
Battery Remaining Longevity: 103 mo
Battery Voltage: 3.05 V
Brady Statistic AP VP Percent: 2.07 %
Brady Statistic AP VS Percent: 0.02 %
Brady Statistic AS VP Percent: 97.39 %
Brady Statistic AS VS Percent: 0.52 %
Brady Statistic RA Percent Paced: 2.36 %
Brady Statistic RV Percent Paced: 99.46 %
Date Time Interrogation Session: 20240327201221
Implantable Lead Connection Status: 753985
Implantable Lead Connection Status: 753985
Implantable Lead Connection Status: 753985
Implantable Lead Implant Date: 20240315
Implantable Lead Implant Date: 20240315
Implantable Lead Implant Date: 20240315
Implantable Lead Location: 753859
Implantable Lead Location: 753860
Implantable Lead Location: 753860
Implantable Lead Model: 3830
Implantable Lead Model: 5076
Implantable Lead Model: 5076
Implantable Pulse Generator Implant Date: 20240315
Lead Channel Impedance Value: 247 Ohm
Lead Channel Impedance Value: 380 Ohm
Lead Channel Impedance Value: 380 Ohm
Lead Channel Impedance Value: 437 Ohm
Lead Channel Impedance Value: 437 Ohm
Lead Channel Impedance Value: 456 Ohm
Lead Channel Impedance Value: 570 Ohm
Lead Channel Impedance Value: 589 Ohm
Lead Channel Impedance Value: 608 Ohm
Lead Channel Pacing Threshold Amplitude: 0.375 V
Lead Channel Pacing Threshold Amplitude: 0.5 V
Lead Channel Pacing Threshold Amplitude: 0.75 V
Lead Channel Pacing Threshold Amplitude: 1.125 V
Lead Channel Pacing Threshold Amplitude: 1.25 V
Lead Channel Pacing Threshold Pulse Width: 0.4 ms
Lead Channel Pacing Threshold Pulse Width: 0.4 ms
Lead Channel Pacing Threshold Pulse Width: 0.4 ms
Lead Channel Pacing Threshold Pulse Width: 0.4 ms
Lead Channel Pacing Threshold Pulse Width: 0.4 ms
Lead Channel Sensing Intrinsic Amplitude: 10.5 mV
Lead Channel Sensing Intrinsic Amplitude: 2.875 mV
Lead Channel Sensing Intrinsic Amplitude: 2.875 mV
Lead Channel Setting Pacing Amplitude: 2.75 V
Lead Channel Setting Pacing Amplitude: 3.5 V
Lead Channel Setting Pacing Amplitude: 3.5 V
Lead Channel Setting Pacing Pulse Width: 0.4 ms
Lead Channel Setting Pacing Pulse Width: 0.4 ms
Lead Channel Setting Sensing Sensitivity: 1.2 mV
Zone Setting Status: 755011
Zone Setting Status: 755011

## 2022-08-29 NOTE — Progress Notes (Signed)
Wound check appointment. Steri-strips removed. Wound without redness or edema. Incision edges approximated, there is a flap noted around incision line but no open area noted and incision line is healed.  There is swelling noted around device site; patient states swelling has increased slightly since last week.  He is currenlty taking an ASA and anti - platelet.  Reviewed with Tommye Standard, PA-C in office.  No need to hold medication.  Patient is to monitor site and contact us if any increase in size and/or signs of infection or drainage from site develop.  Patient has niece present who translates for him and both verbalize understanding.    Normal device function. BI VP 99.5%. Thresholds, sensing, and impedances consistent with implant measurements. Device programmed at 3.5V/auto capture programmed on for extra safety margin until 3 month visit. Histogram distribution appropriate for patient and level of activity. No mode switches or high ventricular rates noted. Patient educated about wound care, arm mobility, lifting restrictions. ROV in 3 months with implanting physician.

## 2022-08-29 NOTE — Patient Instructions (Addendum)
   After Your Pacemaker   Monitor your pacemaker site for redness, swelling, and drainage. Call the device clinic at [phone] if you experience these symptoms or fever/chills.  Your incision was closed with Steri-strips or staples:  You may shower 7 days after your procedure and wash your incision with soap and water. Avoid lotions, ointments, or perfumes over your incision until it is well-healed.  You do have some swelling at your device site.  Okay to apply cold compress (no ice directly to your skin, use cloth barrier).  Monitor for signs of infection or if area increases in size.  If any concerns call our device clinic for further instruction.   You may use a hot tub or a pool after your wound check appointment if the incision is completely closed.  Do not lift, push or pull greater than 10 pounds with the affected arm until 6 weeks after your procedure. UNTIL AFTER APRIL 26. There are no other restrictions in arm movement after your wound check appointment.  You may drive, unless driving has been restricted by your healthcare providers.   Remote monitoring is used to monitor your pacemaker from home. This monitoring is scheduled every 91 days by our office. It allows [place] to keep an eye on the functioning of your device to ensure it is working properly. You will routinely see your Electrophysiologist annually (more often if necessary).

## 2022-08-30 ENCOUNTER — Ambulatory Visit: Payer: 59

## 2022-08-30 ENCOUNTER — Telehealth: Payer: Self-pay

## 2022-08-30 NOTE — Telephone Encounter (Addendum)
Called interpreter services [phone]) for [place] language to help translate appt reminder for pt. Pt did not answer, but vm was left in [place] language by interpreter with msg of his appt tomorrow and time with Lysle Morales.

## 2022-08-31 ENCOUNTER — Ambulatory Visit: Payer: 59 | Attending: Family | Admitting: Family

## 2022-08-31 ENCOUNTER — Encounter: Payer: Self-pay | Admitting: Family

## 2022-08-31 VITALS — BP 104/78 | HR 98 | Wt 107.2 lb

## 2022-08-31 DIAGNOSIS — I213 ST elevation (STEMI) myocardial infarction of unspecified site: Secondary | ICD-10-CM | POA: Insufficient documentation

## 2022-08-31 DIAGNOSIS — I5022 Chronic systolic (congestive) heart failure: Secondary | ICD-10-CM | POA: Diagnosis not present

## 2022-08-31 DIAGNOSIS — R55 Syncope and collapse: Secondary | ICD-10-CM | POA: Diagnosis not present

## 2022-08-31 DIAGNOSIS — I2510 Atherosclerotic heart disease of native coronary artery without angina pectoris: Secondary | ICD-10-CM | POA: Diagnosis not present

## 2022-08-31 DIAGNOSIS — Z950 Presence of cardiac pacemaker: Secondary | ICD-10-CM | POA: Insufficient documentation

## 2022-08-31 DIAGNOSIS — Z79899 Other long term (current) drug therapy: Secondary | ICD-10-CM | POA: Insufficient documentation

## 2022-08-31 DIAGNOSIS — Z7982 Long term (current) use of aspirin: Secondary | ICD-10-CM | POA: Insufficient documentation

## 2022-08-31 DIAGNOSIS — Z7902 Long term (current) use of antithrombotics/antiplatelets: Secondary | ICD-10-CM | POA: Insufficient documentation

## 2022-08-31 NOTE — Patient Instructions (Signed)
Continue weighing daily and call for an overnight weight gain of 3 pounds or more or a weekly weight gain of more than 5 pounds.  °

## 2022-08-31 NOTE — Progress Notes (Signed)
Patient ID: Craig Reid, male    DOB: [birth year], 62 y.o.   MRN: [id number]  HPI  [employer] [place] interpreting services used for the entire visit  Craig Reid is a 62 y/o male with a history of CAD/ STEMI thought to be due to plaque rupture and heart failure  Echo 08/06/22: EF 40-45%  LHC 08/05/22:    Prox RCA lesion is 40% stenosed.   Ost LM to Mid LM lesion is 95% stenosed.   A drug-eluting stent was successfully placed using a STENT ONYX FRONTIER 3.5X12.   Post intervention, there is a 0% residual stenosis.   There is severe left ventricular systolic dysfunction.   LV end diastolic pressure is mildly elevated.   The left ventricular ejection fraction is less than 25% by visual estimate.  1.  Severe ostial left main stenosis with hazy appearance highly suggestive of acute plaque rupture.  No other obstructive disease. 2.  Severely reduced LV systolic function with an EF of 20% with akinesia of the mid to distal anterior, apical and infero-Apical myocardium. 3.  Successful angioplasty and drug-eluting stent placement to the ostial left main coronary artery.  Biv pacemaker implanted 08/17/22. Admitted 08/05/22 due to chest pain, SOB, sweating, fatigue with subsequent syncope needing bystander CPR. Taken to cath lab: STEMI most likely due to plaque rupture ostial left main which was successfully treated with DES stent. LFT's elevated most likely due to hypoperfusion. Bradycardic so possible pacemaker.    Craig Reid presents today for his initial visit with a chief complaint of minimal fatigue with moderate exertion. Describes this as improving. Has associated SOB, light-headedness and difficulty sleeping (unsure of reason). Denies dizziness, abdominal distention, palpitations, pedal edema, chest pain or cough.   Has scales at home and weighs ~ every 2 weeks.   Notes some swelling over pacemaker site and Craig Reid had been advised to use warm compresses over that area. Also notes itching "on the inside"  and Craig Reid had been told that is part of the healing process.   Past Medical History:  Diagnosis Date   CHF (congestive heart failure) ([place])    Coronary artery disease    Past Surgical History:  Procedure Laterality Date   BIV PACEMAKER INSERTION CRT-P N/A 08/17/2022   Procedure: BIV PACEMAKER INSERTION CRT-P;  Surgeon: Deboraha Sprang, MD;  Location: [facility];  Service: Cardiovascular;  Laterality: N/A;   CORONARY/GRAFT ACUTE MI REVASCULARIZATION N/A 08/05/2022   Procedure: Coronary/Graft Acute MI Revascularization;  Surgeon: Wellington Hampshire, MD;  Location: [facility];  Service: Cardiovascular;  Laterality: N/A;   LAPAROTOMY N/A 04/21/2019   Procedure: EXPLORATORY LAPAROTOMY, gastric ulcer repair;  Surgeon: Benjamine Sprague, DO;  Location: [facility];  Service: General;  Laterality: N/A;   LEFT HEART CATH AND CORONARY ANGIOGRAPHY N/A 08/05/2022   Procedure: LEFT HEART CATH AND CORONARY ANGIOGRAPHY;  Surgeon: Wellington Hampshire, MD;  Location: [facility];  Service: Cardiovascular;  Laterality: N/A;   History reviewed. No pertinent family history. Social History   Tobacco Use   Smoking status: Never   Smokeless tobacco: Never  Substance Use Topics   Alcohol use: Never   No Known Allergies  Prior to Admission medications   Medication Sig Start Date End Date Taking? Authorizing Provider  aspirin 81 MG chewable tablet Chew 1 tablet (81 mg total) by mouth daily. 08/10/22 08/10/23 Yes Val Riles, MD  atorvastatin (LIPITOR) 80 MG tablet Take 1 tablet (80 mg total) by mouth daily. 08/10/22 08/10/23  Yes Val Riles, MD  losartan (COZAAR) 25 MG tablet Take 1 tablet (25 mg total) by mouth daily. 08/10/22 11/08/22 Yes Val Riles, MD  Multiple Vitamin (MULTIVITAMIN WITH MINERALS) TABS tablet Take 1 tablet by mouth daily.   Yes [provider]  pantoprazole (PROTONIX) 40 MG tablet Take 1 tablet (40 mg total) by mouth daily. 08/09/22 08/09/23 Yes Val Riles, MD   prasugrel (EFFIENT) 10 MG TABS tablet Take 1 tablet (10 mg total) by mouth daily. 08/10/22 08/10/23 Yes Val Riles, MD   Review of Systems  Constitutional:  Positive for fatigue (minimal). Negative for appetite change.  HENT:  Negative for congestion, postnasal drip and sore throat.   Eyes: Negative.   Respiratory:  Positive for shortness of breath (minimal). Negative for cough and chest tightness.   Cardiovascular:  Negative for chest pain, palpitations and leg swelling.  Gastrointestinal:  Negative for abdominal distention and abdominal pain.  Endocrine: Negative.   Genitourinary: Negative.   Musculoskeletal:  Negative for back pain and neck pain.  Skin: Negative.   Allergic/Immunologic: Negative.   Neurological:  Positive for light-headedness (in the mornings). Negative for dizziness.  Hematological:  Negative for adenopathy. Does not bruise/bleed easily.  Psychiatric/Behavioral:  Positive for sleep disturbance (not sleeping well). Negative for dysphoric mood. The patient is not nervous/anxious.     Vitals:   08/31/22 1438  BP: 104/78  Pulse: 98  SpO2: 99%  Weight: 107 lb 3.2 oz (48.6 kg)   Wt Readings from Last 3 Encounters:  08/31/22 107 lb 3.2 oz (48.6 kg)  08/17/22 102 lb (46.3 kg)  08/15/22 110 lb (49.9 kg)   Lab Results  Component Value Date   CREATININE 0.87 08/15/2022   CREATININE 0.81 08/10/2022   CREATININE 0.88 08/09/2022   Physical Exam Vitals and nursing note reviewed. Exam conducted with a chaperone present (niece and [employer] interpreter).  Constitutional:      Appearance: Normal appearance.  HENT:     Head: Normocephalic and atraumatic.  Cardiovascular:     Rate and Rhythm: Normal rate and regular rhythm.  Pulmonary:     Effort: Pulmonary effort is normal. No respiratory distress.     Breath sounds: No wheezing, rhonchi or rales.  Abdominal:     General: There is no distension.     Palpations: Abdomen is soft.     Tenderness: There is no abdominal  tenderness.  Musculoskeletal:        General: Swelling (under pacemaker insertion site) present.     Cervical back: Normal range of motion and neck supple.     Right lower leg: No edema.     Left lower leg: No edema.  Skin:    General: Skin is warm and dry.  Neurological:     General: No focal deficit present.     Mental Status: Craig Reid is alert and oriented to person, place, and time.  Psychiatric:        Mood and Affect: Mood normal.        Behavior: Behavior normal.        Thought Content: Thought content normal.   Assessment & Plan:  1: Ischemic heart failure with reduced ejection fraction- - NYHA class II - euvolemic - instructed to start weighing every morning and call for an overnight weight gain of > 2 pounds or a weekly weight gain of > 5 pounds - adds "very little" salt to his food - echo 08/06/22: EF 40-45% - continue losartan 25mg  daily - current BP will  make it difficult to add other GDMT - BNP 08/26/22 was 191.1  2: CAD- - STEMI thought to be due to plaque rupture   Prox RCA lesion is 40% stenosed.   Ost LM to Mid LM lesion is 95% stenosed.   A drug-eluting stent was successfully placed using a STENT ONYX FRONTIER 3.5X12.   Post intervention, there is a 0% residual stenosis.   There is severe left ventricular systolic dysfunction.   LV end diastolic pressure is mildly elevated.   The left ventricular ejection fraction is less than 25% by visual estimate.  Severe ostial left main stenosis with hazy appearance highly suggestive of acute plaque rupture.  No other obstructive disease. Severely reduced LV systolic function with an EF of 20% with akinesia of the mid to distal anterior, apical and infero-Apical myocardium. Successful angioplasty and drug-eluting stent placement to the ostial left main coronary artery. - to see cardiology ([place]) 09/14/22 - continue ASA 81mg  daily - continue atorvastatin 80mg  daily - continue prasugrel 10mg  daily  3: Syncope- - saw EP  Quentin Ore) 08/15/22 - sees EP Caryl Comes) 11/20/22 - recently wore a zio monitor which showed episodes of CHB - BIV pacemaker CRT-P placed 08/17/22 - BMP 08/15/22 showed sodium 135, potassium 3.6, creatinine 0.87 & GFR >60 - Mg 08/10/22 was 4.0   Return in 2 months, sooner if needed.

## 2022-09-05 ENCOUNTER — Telehealth: Payer: Self-pay

## 2022-09-05 NOTE — Telephone Encounter (Signed)
-----   Message from Vickie Epley, MD sent at 09/02/2022  3:55 PM EDT ----- He has AF with an elevated CHADSVASc. He will need a visit to discuss anticoagulation.  Can we get him an appointment with Richardson Landry or me in the next week or so in [place]? Carly, I'm happy to see him as an overbook at the end of the day with an interpreter or can see Steve--either way is OK.  Rogue Jury, can you weigh in re: anticoagulation with his recent left main stent. Are you OK dropping aspirin after 30 days and adding a NOAC?  Thanks!  Lars Mage     ----- Message ----- From: Sindy Messing Sent: 09/01/2022   7:16 AM EDT To: Gerrie Nordmann, NP; Valora Corporal, RN; #  Patient status post pacemaker.  Will defer to ordering provider/EP for recommendations regarding anticoagulation.  Follow-up with EP as directed.

## 2022-09-05 NOTE — Telephone Encounter (Signed)
Left message for patient to call back  

## 2022-09-06 NOTE — Telephone Encounter (Signed)
Spoke with the patient's niece. He has an appointment with Christell Faith, PA next Friday and would like to know if it is okay to discuss it at that appointment or if needs to be discussed sooner with EP.

## 2022-09-07 ENCOUNTER — Encounter: Payer: Self-pay | Admitting: Physician Assistant

## 2022-09-07 NOTE — Telephone Encounter (Signed)
Will await EP and interventional cardiology recommendations given complexity of the case. I have never met this patient before.

## 2022-09-07 NOTE — Progress Notes (Signed)
Per interventional cardiology: Iran Ouch, MD  Lanier Prude, MD; Sondra Barges, PA-C "His CHA2DS2-VASc score is 2 so we should decide if we want to continue dual antiplatelet therapy alone versus an antiplatelet medication and anticoagulation. We have to keep in mind that he had a left main stent. If we decide to start anticoagulation, I recommend Eliquis over the other agents and switching Effient to Plavix 75 mg once daily. He did have genotype testing for clopidogrel and he was in normal metabolizer."    Await EP recommendations regarding anticoagulation.

## 2022-09-14 ENCOUNTER — Ambulatory Visit: Payer: 59 | Attending: Physician Assistant | Admitting: Cardiology

## 2022-09-14 ENCOUNTER — Encounter: Payer: Self-pay | Admitting: Physician Assistant

## 2022-09-14 ENCOUNTER — Other Ambulatory Visit
Admission: RE | Admit: 2022-09-14 | Discharge: 2022-09-14 | Disposition: A | Discharge status: Home / Self Care | Payer: 59 | Attending: Cardiology | Admitting: Cardiology

## 2022-09-14 VITALS — BP 118/80 | HR 75 | Ht 62.0 in | Wt 107.8 lb

## 2022-09-14 DIAGNOSIS — I480 Paroxysmal atrial fibrillation: Secondary | ICD-10-CM

## 2022-09-14 DIAGNOSIS — I5022 Chronic systolic (congestive) heart failure: Secondary | ICD-10-CM

## 2022-09-14 DIAGNOSIS — I2510 Atherosclerotic heart disease of native coronary artery without angina pectoris: Secondary | ICD-10-CM

## 2022-09-14 DIAGNOSIS — Z950 Presence of cardiac pacemaker: Secondary | ICD-10-CM

## 2022-09-14 DIAGNOSIS — I420 Dilated cardiomyopathy: Secondary | ICD-10-CM

## 2022-09-14 DIAGNOSIS — R55 Syncope and collapse: Secondary | ICD-10-CM | POA: Diagnosis present

## 2022-09-14 LAB — CBC
HCT: 43.1 % (ref 39.0–52.0)
Hemoglobin: 13.5 g/dL (ref 13.0–17.0)
MCH: 30.5 pg (ref 26.0–34.0)
MCHC: 31.3 g/dL (ref 30.0–36.0)
MCV: 97.5 fL (ref 80.0–100.0)
Platelets: 225 K/uL (ref 150–400)
RBC: 4.42 MIL/uL (ref 4.22–5.81)
RDW: 11.9 % (ref 11.5–15.5)
WBC: 7.9 K/uL (ref 4.0–10.5)
nRBC: 0 % (ref 0.0–0.2)

## 2022-09-14 LAB — COMPREHENSIVE METABOLIC PANEL WITH GFR
ALT: 32 U/L (ref 0–44)
AST: 31 U/L (ref 15–41)
Albumin: 4.4 g/dL (ref 3.5–5.0)
Alkaline Phosphatase: 73 U/L (ref 38–126)
Anion gap: 10 (ref 5–15)
BUN: 22 mg/dL — ABNORMAL HIGH (ref 6–20)
CO2: 27 mmol/L (ref 22–32)
Calcium: 9.5 mg/dL (ref 8.9–10.3)
Chloride: 100 mmol/L (ref 98–111)
Creatinine, Ser: 0.82 mg/dL (ref 0.61–1.24)
GFR, Estimated: >60 mL/min
Glucose, Bld: 95 mg/dL (ref 70–99)
Potassium: 4.1 mmol/L (ref 3.5–5.1)
Sodium: 137 mmol/L (ref 135–145)
Total Bilirubin: 1 mg/dL (ref 0.3–1.2)
Total Protein: 8.4 g/dL — ABNORMAL HIGH (ref 6.5–8.1)

## 2022-09-14 MED ORDER — APIXABAN 5 MG PO TABS: 5.0000 mg | ORAL_TABLET | Freq: Two times a day (BID) | ORAL | 11 refills | Status: DC

## 2022-09-14 MED ORDER — CLOPIDOGREL BISULFATE 75 MG PO TABS: ORAL_TABLET | ORAL | 0 refills | Status: DC

## 2022-09-14 MED ORDER — CLOPIDOGREL BISULFATE 75 MG PO TABS: 75.0000 mg | ORAL_TABLET | Freq: Every day | ORAL | 3 refills | Status: DC

## 2022-09-14 NOTE — Progress Notes (Signed)
Cardiology Office Note:    Date:  09/14/2022   ID:  Craig Reid, DOB Jan 21, 1961, MRN [id number]  PCP:  [name], FNP   [facility] Providers Cardiologist:  Lorine Bears, MD Electrophysiologist:  Lanier Prude, MD     Referring MD: [name], FNP   CC: follow up CAD  History of Present Illness:    Craig Reid is a 62 y.o. male with a hx of CAD NSTEMI, biventricular pacemaker placed on 08/17/2022, HFrEF ICM, HTN.   Admitted 08/05/2022 due to chest pain, shortness of breath, diaphoresis, syncope with collapse and apparently required bystander CPR.  He was taken to the Cath Lab, STEMI was felt to be most likely due to plaque rupture ostial vein which was successfully treated with DES.  EF was 20% at time of catheterization, repeat echo revealed 40 to 45%.  He continued to have a high-grade AV block.  He was ultimately discharged from hospital wearing a ZIO monitor, which alerted several times for possible high degree AV block, as well as atrial fibrillation  He was evaluated by Dr. Lalla Brothers on 08/15/2022 following his discharge, he reported chest pain and tightness typically at night, symptoms were felt to be attributed to episodes of CHB. He underwent CRT-P implantation by Dr. Graciela Husbands on 08/17/22. Discussion with Drs. Kirke Corin and [place] regarding antiplatelet and anticoagulant moving forward, plan per below. Marland Kitchen   He was recently evaluated by Clarisa Kindred NP, on 08/31/2022 to establish with the heart failure clinic.  Heart failure instructions were reviewed with him, including daily weights, limited sodium intake.  His blood pressure was prohibitive of further additions of GDMT.  He presents today for follow-up of his CAD and heart failure.  He states he is doing well overall.  He has had a few episodes where he feels that his heart is racing, states this time only occurs when he is active.  He is returned to light duty work and is tolerating this without  incident.  We reviewed his pacemaker discharge instructions, discussing that he can return to work on 4/26.  Discussed the recent ZIO results which showed atrial fibrillation. He denies chest pain, palpitations, dyspnea, pnd, orthopnea, n, v, dizziness, syncope, edema, weight gain, or early satiety.    Past Medical History:  Diagnosis Date   CHF (congestive heart failure)    Coronary artery disease     Past Surgical History:  Procedure Laterality Date   BIV PACEMAKER INSERTION CRT-P N/A 08/17/2022   Procedure: BIV PACEMAKER INSERTION CRT-P;  Surgeon: Duke Salvia, MD;  Location: [facility] INVASIVE CV LAB;  Service: Cardiovascular;  Laterality: N/A;   CORONARY/GRAFT ACUTE MI REVASCULARIZATION N/A 08/05/2022   Procedure: Coronary/Graft Acute MI Revascularization;  Surgeon: Iran Ouch, MD;  Location: [facility] INVASIVE CV LAB;  Service: Cardiovascular;  Laterality: N/A;   LAPAROTOMY N/A 04/21/2019   Procedure: EXPLORATORY LAPAROTOMY, gastric ulcer repair;  Surgeon: Sung Amabile, DO;  Location: [facility] ORS;  Service: General;  Laterality: N/A;   LEFT HEART CATH AND CORONARY ANGIOGRAPHY N/A 08/05/2022   Procedure: LEFT HEART CATH AND CORONARY ANGIOGRAPHY;  Surgeon: Iran Ouch, MD;  Location: [facility] INVASIVE CV LAB;  Service: Cardiovascular;  Laterality: N/A;    Current Medications: Current Meds  Medication Sig   apixaban (ELIQUIS) 5 MG TABS tablet Take 1 tablet (5 mg total) by mouth 2 (two) times daily.   atorvastatin (LIPITOR) 80 MG tablet Take 1 tablet (80 mg total) by mouth daily.   clopidogrel (PLAVIX)  75 MG tablet Take 2 tablets (150 mg total) by mouth daily for 1 day, THEN 1 tablet (75 mg total) daily.   [START ON 10/03/2022] clopidogrel (PLAVIX) 75 MG tablet Take 1 tablet (75 mg total) by mouth daily.   losartan (COZAAR) 25 MG tablet Take 1 tablet (25 mg total) by mouth daily.   Multiple Vitamin (MULTIVITAMIN WITH MINERALS) TABS tablet Take 1 tablet by mouth daily.   pantoprazole  (PROTONIX) 40 MG tablet Take 1 tablet (40 mg total) by mouth daily.   [DISCONTINUED] aspirin 81 MG chewable tablet Chew 1 tablet (81 mg total) by mouth daily.   [DISCONTINUED] prasugrel (EFFIENT) 10 MG TABS tablet Take 1 tablet (10 mg total) by mouth daily.     Allergies:   Patient has no known allergies.   Social History   Socioeconomic History   Marital status: Married    Spouse name: Not on file   Number of children: Not on file   Years of education: Not on file   Highest education level: Not on file  Occupational History   Not on file  Tobacco Use   Smoking status: Never   Smokeless tobacco: Never  Vaping Use   Vaping Use: Never used  Substance and Sexual Activity   Alcohol use: Never   Drug use: Not on file   Sexual activity: Not on file  Other Topics Concern   Not on file  Social History Narrative   Not on file   Social Determinants of Health   Financial Resource Strain: Not on file  Food Insecurity: No Food Insecurity (08/06/2022)   Hunger Vital Sign    Worried About Running Out of Food in the Last Year: Never true    Ran Out of Food in the Last Year: Never true  Transportation Needs: No Transportation Needs (08/06/2022)   PRAPARE - Administrator, Civil Service (Medical): No    Lack of Transportation (Non-Medical): No  Physical Activity: Not on file  Stress: Not on file  Social Connections: Unknown (08/06/2022)   Social Connection and Isolation Panel [NHANES]    Frequency of Communication with Friends and Family: More than three times a week    Frequency of Social Gatherings with Friends and Family: More than three times a week    Attends Religious Services: 1 to 4 times per year    Active Member of [employer] or Organizations: No    Attends Banker Meetings: Never    Marital Status: Patient declined     Family History: The patient's family history is not on file.  ROS:   Please see the history of present illness.    All other systems  reviewed and are negative.  EKGs/Labs/Other Studies Reviewed:    The following studies were reviewed today:  Cardiac Studies & Procedures   CARDIAC CATHETERIZATION  CARDIAC CATHETERIZATION 08/05/2022  Narrative   Prox RCA lesion is 40% stenosed.   Ost LM to Mid LM lesion is 95% stenosed.   A drug-eluting stent was successfully placed using a STENT ONYX FRONTIER 3.5X12.   Post intervention, there is a 0% residual stenosis.   There is severe left ventricular systolic dysfunction.   LV end diastolic pressure is mildly elevated.   The left ventricular ejection fraction is less than 25% by visual estimate.  1.  Severe ostial left main stenosis with hazy appearance highly suggestive of acute plaque rupture.  No other obstructive disease. 2.  Severely reduced LV systolic function with an  EF of 20% with akinesia of the mid to distal anterior, apical and infero-Apical myocardium. 3.  Successful angioplasty and drug-eluting stent placement to the ostial left main coronary artery.  Recommendations: Dual antiplatelet therapy for at least 12 months. No beta-blockers for now due to bradycardia.  Monitor heart rate and blood pressure closely as the patient is at risk for decompensation given degree of myocardium at risk. Delays and door to device time related to language barrier and the difficulty of obtaining an interpreter.  Findings Coronary Findings Diagnostic  Dominance: Right  Left Main Ost LM to Mid LM lesion is 95% stenosed. Vessel is the culprit lesion. The lesion is type C. The lesion was not previously treated .  Left Anterior Descending The vessel exhibits minimal luminal irregularities.  First Diagonal Branch Vessel is angiographically normal.  Second Diagonal Branch Vessel is angiographically normal.  Third Diagonal Branch Vessel is angiographically normal.  Left Circumflex Vessel is small. The vessel exhibits minimal luminal irregularities.  Right Coronary  Artery Prox RCA lesion is 40% stenosed.  Intervention  Ost LM to Mid LM lesion Stent Lesion length:  6 mm. CATH VISTA GUIDE 6FR XBLAD3.5 guide catheter was inserted. Lesion crossed with guidewire using a WIRE RUNTHROUGH .[id number]. Pre-stent angioplasty was performed using a BALLN TREK RX [id number]. Maximum pressure:  8 atm. Inflation time:  15 sec. A drug-eluting stent was successfully placed using a STENT ONYX FRONTIER 3.5X12. Maximum pressure: 12 atm. Inflation time: 15 sec. Post-stent angioplasty was not performed. The stent balloon was pulled back a few millimeters and inflated to 14 atm to flare the ostium. Post-Intervention Lesion Assessment The intervention was successful. Pre-interventional TIMI flow is 2. Post-intervention TIMI flow is 3. No complications occurred at this lesion. There is a 0% residual stenosis post intervention.     ECHOCARDIOGRAM  ECHOCARDIOGRAM COMPLETE 08/06/2022  Narrative ECHOCARDIOGRAM REPORT    Patient Name:   Craig Reid Date of Exam: 08/06/2022 Medical Rec #:  [id number]            Height:       63.0 in Accession #:    [id number]           Weight:       130.3 lb Date of Birth:  11-Mar-1961             BSA:          1.612 m Patient Age:    61 years             BP:           114/68 mmHg Patient Gender: M                    HR:           125 bpm. Exam Location:  [facility]  Procedure: 2D Echo, Cardiac Doppler and Color Doppler  Indications:     Acute myocardial infarction I21.9  History:         Patient has no prior history of Echocardiogram examinations. No past medical history on file.  Sonographer:     Cristela Blue Referring Phys:  [id number] A ARIDA Diagnosing Phys: Lorine Bears MD  IMPRESSIONS   1. Left ventricular ejection fraction, by estimation, is 40 to 45%. The left ventricle has mildly decreased function. The left ventricle demonstrates global hypokinesis. Left ventricular diastolic parameters are indeterminate. 2. Right  ventricular systolic function is normal. The right ventricular size is normal. There is normal pulmonary artery systolic  pressure. 3. The mitral valve is normal in structure. Mild mitral valve regurgitation. No evidence of mitral stenosis. 4. The aortic valve is normal in structure. Aortic valve regurgitation is trivial. Aortic valve sclerosis is present, with no evidence of aortic valve stenosis.  FINDINGS Left Ventricle: Left ventricular ejection fraction, by estimation, is 40 to 45%. The left ventricle has mildly decreased function. The left ventricle demonstrates global hypokinesis. The left ventricular internal cavity size was normal in size. There is borderline left ventricular hypertrophy. Left ventricular diastolic parameters are indeterminate.  Right Ventricle: The right ventricular size is normal. No increase in right ventricular wall thickness. Right ventricular systolic function is normal. There is normal pulmonary artery systolic pressure. The tricuspid regurgitant velocity is 2.44 m/s, and with an assumed right atrial pressure of 5 mmHg, the estimated right ventricular systolic pressure is 28.8 mmHg.  Left Atrium: Left atrial size was normal in size.  Right Atrium: Right atrial size was normal in size.  Pericardium: There is no evidence of pericardial effusion.  Mitral Valve: The mitral valve is normal in structure. Mild mitral valve regurgitation. No evidence of mitral valve stenosis.  Tricuspid Valve: The tricuspid valve is normal in structure. Tricuspid valve regurgitation is mild . No evidence of tricuspid stenosis.  Aortic Valve: The aortic valve is normal in structure. Aortic valve regurgitation is trivial. Aortic valve sclerosis is present, with no evidence of aortic valve stenosis. Aortic valve mean gradient measures 2.0 mmHg. Aortic valve peak gradient measures 3.2 mmHg. Aortic valve area, by VTI measures 3.39 cm.  Pulmonic Valve: The pulmonic valve was normal in  structure. Pulmonic valve regurgitation is trivial. No evidence of pulmonic stenosis.  Aorta: The aortic root is normal in size and structure.  Venous: The inferior vena cava was not well visualized.  IAS/Shunts: No atrial level shunt detected by color flow Doppler.   LEFT VENTRICLE PLAX 2D LVIDd:         4.50 cm LVIDs:         3.20 cm LV PW:         1.10 cm LV IVS:        1.10 cm LVOT diam:     2.10 cm LV SV:         48 LV SV Index:   30 LVOT Area:     3.46 cm   RIGHT VENTRICLE RV Basal diam:  4.60 cm RV Mid diam:    3.80 cm RV S prime:     13.10 cm/s TAPSE (M-mode): 2.8 cm  LEFT ATRIUM             Index        RIGHT ATRIUM           Index LA diam:        2.70 cm 1.68 cm/m   RA Area:     17.00 cm LA Vol (A2C):   66.6 ml 41.32 ml/m  RA Volume:   49.40 ml  30.65 ml/m LA Vol (A4C):   28.5 ml 17.68 ml/m LA Biplane Vol: 42.2 ml 26.18 ml/m AORTIC VALVE AV Area (Vmax):    2.55 cm AV Area (Vmean):   2.23 cm AV Area (VTI):     3.39 cm AV Vmax:           89.90 cm/s AV Vmean:          66.000 cm/s AV VTI:            0.141 m AV Peak Grad:  3.2 mmHg AV Mean Grad:      2.0 mmHg LVOT Vmax:         66.30 cm/s LVOT Vmean:        42.500 cm/s LVOT VTI:          0.138 m LVOT/AV VTI ratio: 0.98  AORTA Ao Root diam: 3.30 cm  MITRAL VALVE               TRICUSPID VALVE MV Area (PHT): 4.73 cm    TR Peak grad:   23.8 mmHg MV Decel Time: 161 msec    TR Vmax:        244.00 cm/s MV E velocity: 73.90 cm/s MV A velocity: 32.50 cm/s  SHUNTS MV E/A ratio:  2.27        Systemic VTI:  0.14 m Systemic Diam: 2.10 cm  Lorine Bears MD Electronically signed by Lorine Bears MD Signature Date/Time: 08/06/2022/1:37:33 PM    Final    MONITORS  LONG TERM MONITOR (3-14 DAYS) 08/15/2022            EKG:  EKG is ordered today.  The ekg ordered today demonstrates AV paced, HR 75 bpm.   Recent Labs: 08/06/2022: B Natriuretic Peptide 191.1 08/07/2022: ALT 56; TSH 2.107 08/10/2022:  Magnesium 2.1 08/15/2022: BUN 26; Creatinine, Ser 0.87; Potassium 3.6; Sodium 135 09/14/2022: Hemoglobin 13.5; Platelets 225  Recent Lipid Panel    Component Value Date/Time   CHOL 155 08/05/2022 2145   TRIG 78 08/05/2022 2145   HDL 58 08/05/2022 2145   CHOLHDL 2.7 08/05/2022 2145   VLDL 16 08/05/2022 2145   LDLCALC 81 08/05/2022 2145     Risk Assessment/Calculations:    CHA2DS2-VASc Score = 3   This indicates a 3.2% annual risk of stroke. The patient's score is based upon: CHF History: 1 HTN History: 1 Diabetes History: 0 Stroke History: 0 Vascular Disease History: 1 Age Score: 0 Gender Score: 0               Physical Exam:    VS:  BP 118/80 (BP Location: Left Arm, Patient Position: Sitting, Cuff Size: Normal)   Pulse 75   Ht 5\' 2"  (1.575 m)   Wt 107 lb 12.8 oz (48.9 kg)   SpO2 99%   BMI 19.72 kg/m     Wt Readings from Last 3 Encounters:  09/14/22 107 lb 12.8 oz (48.9 kg)  08/31/22 107 lb 3.2 oz (48.6 kg)  08/17/22 102 lb (46.3 kg)     GEN:  Well nourished, well developed in no acute distress HEENT: Normal NECK: No JVD; No carotid bruits LYMPHATICS: No lymphadenopathy CARDIAC: RRR, no murmurs, rubs, gallops RESPIRATORY:  Clear to auscultation without rales, wheezing or rhonchi  ABDOMEN: Soft, non-tender, non-distended MUSCULOSKELETAL:  No edema; No deformity  SKIN: Warm and dry NEUROLOGIC:  Alert and oriented x 3 PSYCHIATRIC:  Normal affect   ASSESSMENT:    1. Chronic systolic heart failure   2. Coronary artery disease involving native coronary artery of native heart without angina pectoris   3. Syncope and collapse   4. Dilated cardiomyopathy   5. Cardiac resynchronization therapy pacemaker (CRT-P) in place   6. PAF (paroxysmal atrial fibrillation)    PLAN:    In order of problems listed above:  CAD - s/p cardiac arrest, LHC revealed severe ostial left main stenosis with hazy appearance highly suggestive of acute plaque rupture, s/p DES to  ostial LM. Stable with no anginal symptoms. No indication for ischemic evaluation.  Heart healthy diet and regular cardiovascular exercise encouraged.  Discontinue aspirin, discontinue Effient.  Start Plavix load with 300 mg on first day, subsequent dosing 75 mg daily.  Continue to atorvastatin 80 mg daily.  Repeat CMP, CBC.  Repeat CBC again in 2 weeks.  Paroxysmal atrial fibrillation -noted on ZIO monitor, he reports occasional palpitations with exertion, CHA2DS2-VASc score of 3.  Start Eliquis 5 mg twice a day--no indication for dose reduction.  Reviewed bleeding precautions.  HFrEF -EF at time of cath was 20%, reviewed, repeat echo following day showed EF 40 to 45%, mildly decreased function, global hypokinesis.  NYHA class I, euvolemic.  Continue losartan 25 mg daily.  Further GDMT had not been added due to marginal blood pressure readings however his blood pressure is well-controlled today.  We are starting Eliquis and Plavix per above.  Consider further GDMT at next OV.  ICM/CHB/presence of CRT-P - AV paced on EKG, still adhering to PM implantation precautions.   Disposition-CBC, CMET today.  Discontinue aspirin, Effient.  Start Plavix, load with 300 mg x 1 day, then 75 mg daily.  Start Eliquis 5 mg twice a day.  Repeat CBC in 2 weeks.  Consider further GDMT at next OV.     Cardiac Rehabilitation Eligibility Assessment            Medication Adjustments/Labs and Tests Ordered: Current medicines are reviewed at length with the patient today.  Concerns regarding medicines are outlined above.  Orders Placed This Encounter  Procedures   Comp Met (CMET)   CBC   EKG 12-Lead   Meds ordered this encounter  Medications   clopidogrel (PLAVIX) 75 MG tablet    Sig: Take 2 tablets (150 mg total) by mouth daily for 1 day, THEN 1 tablet (75 mg total) daily.    Dispense:  31 tablet    Refill:  0   clopidogrel (PLAVIX) 75 MG tablet    Sig: Take 1 tablet (75 mg total) by mouth daily.     Dispense:  90 tablet    Refill:  3   apixaban (ELIQUIS) 5 MG TABS tablet    Sig: Take 1 tablet (5 mg total) by mouth 2 (two) times daily.    Dispense:  60 tablet    Refill:  11    Patient Instructions  May return to work on 4/26   Medication Instructions:  Your physician has recommended you make the following change in your medication:   STOP Aspirin STOP Effient START Plavix 75 mg take 4 tablets on first day THEN take Plavix 75 mg once a daily START Eliquis 5 mg twice a day  *If you need a refill on your cardiac medications before your next appointment, please call your pharmacy*   Lab Work: CMET & CBC today over at the [facility]. Stop at registration desk to check in.   Repeat labs (CBC) in 2 weeks. No appointment is needed. Just go to the [facility] entrance and check in at registration desk.   If you have labs (blood work) drawn today and your tests are completely normal, you will receive your results only by: MyChart Message (if you have MyChart) OR A paper copy in the mail If you have any lab test that is abnormal or we need to change your treatment, we will call you to review the results.   Testing/Procedures: None   Follow-Up: At [facility], you and your health needs are our priority.  As part of  our continuing mission to provide you with exceptional heart care, we have created designated Provider Care Teams.  These Care Teams include your primary Cardiologist (physician) and Advanced Practice Providers (APPs -  Physician Assistants and Nurse Practitioners) who all work together to provide you with the care you need, when you need it.  We recommend signing up for the patient portal called "MyChart".  Sign up information is provided on this After Visit Summary.  MyChart is used to connect with patients for Virtual Visits (Telemedicine).  Patients are able to view lab/test results, encounter notes, upcoming appointments, etc.  Non-urgent  messages can be sent to your provider as well.   To learn more about what you can do with MyChart, go to [email or web address].    Your next appointment:   8 week(s)  Provider:   Lorine Bears, MD or Eula Listen, PA-C     Signed, Flossie Dibble, NP  09/14/2022 12:18 PM    [facility]

## 2022-09-14 NOTE — Patient Instructions (Addendum)
May return to work on 4/26   Medication Instructions:  Your physician has recommended you make the following change in your medication:   STOP Aspirin STOP Effient START Plavix 75 mg take 4 tablets on first day THEN take Plavix 75 mg once a daily START Eliquis 5 mg twice a day  *If you need a refill on your cardiac medications before your next appointment, please call your pharmacy*   Lab Work: CMET & CBC today over at the [facility]. Stop at registration desk to check in.   Repeat labs (CBC) in 2 weeks. No appointment is needed. Just go to the [facility] entrance and check in at registration desk.   If you have labs (blood work) drawn today and your tests are completely normal, you will receive your results only by: MyChart Message (if you have MyChart) OR A paper copy in the mail If you have any lab test that is abnormal or we need to change your treatment, we will call you to review the results.   Testing/Procedures: None   Follow-Up: At [facility], you and your health needs are our priority.  As part of our continuing mission to provide you with exceptional heart care, we have created designated Provider Care Teams.  These Care Teams include your primary Cardiologist (physician) and Advanced Practice Providers (APPs -  Physician Assistants and Nurse Practitioners) who all work together to provide you with the care you need, when you need it.  We recommend signing up for the patient portal called "MyChart".  Sign up information is provided on this After Visit Summary.  MyChart is used to connect with patients for Virtual Visits (Telemedicine).  Patients are able to view lab/test results, encounter notes, upcoming appointments, etc.  Non-urgent messages can be sent to your provider as well.   To learn more about what you can do with MyChart, go to [email or web address].    Your next appointment:   8 week(s)  Provider:   Lorine Bears, MD or Eula Listen, PA-C

## 2022-09-21 ENCOUNTER — Telehealth: Payer: Self-pay | Admitting: Cardiovascular Disease

## 2022-09-21 NOTE — Telephone Encounter (Signed)
Call transferred to me and I spoke with Dr. Ambrose Pancoast directly. He requested clearance for dental extraction today, patient in the chair, due to concern for infection. Patient underwent pacemaker implant on 08/17/2022, therefore I advised that he should wait 6 weeks post implant for any further intervention. Additionally, he had cardiac arrest/STEMI with cath that revealed likely acute plaque rupture.   Dr. Ambrose Pancoast asked about contraindication to antibiotics due to concern for infection. I advised no contraindication to him receiving antibiotic, however patient does not require SBE prophylaxis.  Routing request to Dr. Kirke Corin regarding antiplatelet therapy and to pharmacist regarding OAC.    Levi Aland, NP-C  09/21/2022, 4:07 PM [address], [address] Office [phone] Fax [phone]

## 2022-09-21 NOTE — Telephone Encounter (Signed)
   Pre-operative Risk Assessment    Patient Name: Filip Luten  DOB: 08-04-1960 MRN: [id number]     Request for Surgical Clearance    Procedure:  Dental Extraction - Amount of Teeth to be Pulled:  3  Date of Surgery:  Clearance 09/21/22                                 Surgeon:  Dr. Ambrose Pancoast Surgeon's Group or Practice Name:  [facility] Phone number:  [phone] Fax number:  [phone]   Type of Clearance Requested:   - Medical    Type of Anesthesia:   Lidocaine 2%   Additional requests/questions:  Please advise surgeon/provider what medications should be held.  Barbette Reichmann   09/21/2022, 3:26 PM

## 2022-09-21 NOTE — Telephone Encounter (Signed)
Dental infections are not good and might lead to bacteremia and device infection. They can proceed with dental extraction and can hold Eliquis 2 days before but should continue clopidogrel 75 mg once daily without interruption.

## 2022-09-24 NOTE — Telephone Encounter (Signed)
Please advise Dr. Graciela Husbands if any recommendations or ok to clear? Provider below noted concern for infection. PPM was implanted 08/17/2022.

## 2022-09-24 NOTE — Telephone Encounter (Signed)
   Primary Cardiologist: Lorine Bears, MD  Chart reviewed as part of pre-operative protocol coverage.   Recommendation to hold Eliquis for 2 days prior to extraction but to continue clopidogrel 75 mg daily without interruption.   SBE prophylaxis is not required for the patient, however if patient presents with suspicion for dental infection, antibiotic therapy is recommended.   Additionally, I will forward request to our device team for input regarding device management during the procedure.   I will route this recommendation to the requesting party via Epic fax function and remove from pre-op pool.  Please call with questions.  Levi Aland, NP-C  09/24/2022, 7:28 AM [address], [address] Office [phone] Fax [phone]

## 2022-09-28 ENCOUNTER — Other Ambulatory Visit
Admission: RE | Admit: 2022-09-28 | Discharge: 2022-09-28 | Disposition: A | Discharge status: Home / Self Care | Payer: 59 | Attending: Cardiology | Admitting: Cardiology

## 2022-09-28 DIAGNOSIS — I2510 Atherosclerotic heart disease of native coronary artery without angina pectoris: Secondary | ICD-10-CM | POA: Diagnosis not present

## 2022-09-28 DIAGNOSIS — I420 Dilated cardiomyopathy: Secondary | ICD-10-CM

## 2022-09-28 DIAGNOSIS — I480 Paroxysmal atrial fibrillation: Secondary | ICD-10-CM | POA: Diagnosis not present

## 2022-09-28 DIAGNOSIS — I5022 Chronic systolic (congestive) heart failure: Secondary | ICD-10-CM | POA: Diagnosis not present

## 2022-09-28 DIAGNOSIS — Z950 Presence of cardiac pacemaker: Secondary | ICD-10-CM

## 2022-09-28 DIAGNOSIS — R55 Syncope and collapse: Secondary | ICD-10-CM | POA: Diagnosis not present

## 2022-09-28 LAB — CBC
HCT: 40 % (ref 39.0–52.0)
Hemoglobin: 12.9 g/dL — ABNORMAL LOW (ref 13.0–17.0)
MCH: 31.2 pg (ref 26.0–34.0)
MCHC: 32.3 g/dL (ref 30.0–36.0)
MCV: 96.9 fL (ref 80.0–100.0)
Platelets: 206 K/uL (ref 150–400)
RBC: 4.13 MIL/uL — ABNORMAL LOW (ref 4.22–5.81)
RDW: 11.9 % (ref 11.5–15.5)
WBC: 6.2 K/uL (ref 4.0–10.5)
nRBC: 0 % (ref 0.0–0.2)

## 2022-10-01 ENCOUNTER — Other Ambulatory Visit: Payer: Self-pay

## 2022-10-01 DIAGNOSIS — I420 Dilated cardiomyopathy: Secondary | ICD-10-CM

## 2022-10-01 DIAGNOSIS — I2510 Atherosclerotic heart disease of native coronary artery without angina pectoris: Secondary | ICD-10-CM

## 2022-10-01 DIAGNOSIS — I2101 ST elevation (STEMI) myocardial infarction involving left main coronary artery: Secondary | ICD-10-CM

## 2022-10-03 NOTE — Telephone Encounter (Signed)
Please advise Dr. Lalla Brothers if any recommendations or ok to clear? Provider below noted concern for infection. PPM was implanted 08/17/2022.

## 2022-10-04 NOTE — Telephone Encounter (Signed)
    Primary Cardiologist: Lorine Bears, MD  Chart reviewed as part of pre-operative protocol coverage. Simple dental extractions are considered low risk procedures per guidelines and generally do not require any specific cardiac clearance. It is also generally accepted that for simple extractions and dental cleanings, there is no need to interrupt blood thinner therapy.   Per Dr. Lalla Brothers "Would recommend course of antibiotics post extraction to reduce chances of bacteremia/pacemaker associated infection."  His Eliquis may be held for 2 days prior to his procedure.  His clopidogrel will need to be continued without interruption.  I will route this recommendation to the requesting party via Epic fax function and remove from pre-op pool.  Please call with questions.  Ronney Asters, NP 10/04/2022, 3:39 PM

## 2022-10-08 ENCOUNTER — Other Ambulatory Visit: Payer: Self-pay | Admitting: Physician Assistant

## 2022-10-19 ENCOUNTER — Other Ambulatory Visit
Admission: RE | Admit: 2022-10-19 | Discharge: 2022-10-19 | Disposition: A | Discharge status: Home / Self Care | Payer: 59 | Source: Ambulatory Visit | Attending: Cardiology | Admitting: Cardiology

## 2022-10-19 ENCOUNTER — Encounter: Payer: Self-pay | Admitting: Emergency Medicine

## 2022-10-19 DIAGNOSIS — I2510 Atherosclerotic heart disease of native coronary artery without angina pectoris: Secondary | ICD-10-CM | POA: Diagnosis not present

## 2022-10-19 DIAGNOSIS — I420 Dilated cardiomyopathy: Secondary | ICD-10-CM

## 2022-10-19 DIAGNOSIS — I2101 ST elevation (STEMI) myocardial infarction involving left main coronary artery: Secondary | ICD-10-CM | POA: Diagnosis not present

## 2022-10-19 DIAGNOSIS — Z9861 Coronary angioplasty status: Secondary | ICD-10-CM | POA: Insufficient documentation

## 2022-10-19 LAB — CBC
HCT: 40.9 % (ref 39.0–52.0)
Hemoglobin: 13.1 g/dL (ref 13.0–17.0)
MCH: 30.8 pg (ref 26.0–34.0)
MCHC: 32 g/dL (ref 30.0–36.0)
MCV: 96.2 fL (ref 80.0–100.0)
Platelets: 183 K/uL (ref 150–400)
RBC: 4.25 MIL/uL (ref 4.22–5.81)
RDW: 11.7 % (ref 11.5–15.5)
WBC: 5.8 K/uL (ref 4.0–10.5)
nRBC: 0 % (ref 0.0–0.2)

## 2022-11-02 ENCOUNTER — Ambulatory Visit: Payer: 59 | Attending: Family | Admitting: Family

## 2022-11-02 ENCOUNTER — Encounter: Payer: Self-pay | Admitting: Family

## 2022-11-02 VITALS — BP 110/78 | HR 81 | Wt 107.8 lb

## 2022-11-02 DIAGNOSIS — Z950 Presence of cardiac pacemaker: Secondary | ICD-10-CM | POA: Insufficient documentation

## 2022-11-02 DIAGNOSIS — I252 Old myocardial infarction: Secondary | ICD-10-CM | POA: Diagnosis not present

## 2022-11-02 DIAGNOSIS — Z79899 Other long term (current) drug therapy: Secondary | ICD-10-CM | POA: Insufficient documentation

## 2022-11-02 DIAGNOSIS — I2510 Atherosclerotic heart disease of native coronary artery without angina pectoris: Secondary | ICD-10-CM | POA: Diagnosis not present

## 2022-11-02 DIAGNOSIS — I420 Dilated cardiomyopathy: Secondary | ICD-10-CM

## 2022-11-02 DIAGNOSIS — Z7902 Long term (current) use of antithrombotics/antiplatelets: Secondary | ICD-10-CM | POA: Insufficient documentation

## 2022-11-02 DIAGNOSIS — Z7901 Long term (current) use of anticoagulants: Secondary | ICD-10-CM | POA: Diagnosis not present

## 2022-11-02 DIAGNOSIS — R55 Syncope and collapse: Secondary | ICD-10-CM

## 2022-11-02 DIAGNOSIS — I5022 Chronic systolic (congestive) heart failure: Secondary | ICD-10-CM | POA: Insufficient documentation

## 2022-11-02 DIAGNOSIS — I480 Paroxysmal atrial fibrillation: Secondary | ICD-10-CM

## 2022-11-02 MED ORDER — METOPROLOL SUCCINATE ER 25 MG PO TB24: 25.0000 mg | ORAL_TABLET | Freq: Every day | ORAL | 5 refills | Status: DC

## 2022-11-02 NOTE — Progress Notes (Signed)
PCP: Azzie Roup, FNP  Primary Cardiologist: Lorine Bears, MD (last seen 04/24)  HPI:  Patient deferred interpreter as he used his niece who was present  Craig Reid is a 62 y/o male with a history of CAD/ STEMI thought to be due to plaque rupture and heart failure, biventricular pacemaker placed on 08/17/22, ICM and HTN.   Echo 08/06/22: EF 40-45%  LHC 08/05/22:    Prox RCA lesion is 40% stenosed.   Ost LM to Mid LM lesion is 95% stenosed.   A drug-eluting stent was successfully placed using a STENT ONYX FRONTIER 3.5X12.   Post intervention, there is a 0% residual stenosis.   There is severe left ventricular systolic dysfunction.   LV end diastolic pressure is mildly elevated.   The left ventricular ejection fraction is less than 25% by visual estimate.  1.  Severe ostial left main stenosis with hazy appearance highly suggestive of acute plaque rupture.  No other obstructive disease. 2.  Severely reduced LV systolic function with an EF of 20% with akinesia of the mid to distal anterior, apical and infero-Apical myocardium. 3.  Successful angioplasty and drug-eluting stent placement to the ostial left main coronary artery.  Biv pacemaker implanted 08/17/22. Admitted 08/05/22 due to chest pain, SOB, sweating, fatigue with subsequent syncope needing bystander CPR. Taken to cath lab: STEMI most likely due to plaque rupture ostial left main which was successfully treated with DES stent. LFT's elevated most likely due to hypoperfusion. Bradycardic so possible pacemaker.    He presents today for a HF f/u visit with a chief complaint of minimal fatigue with moderate exertion. Chronic in nature. Has associated intermittent palpitations along with this. Says that he notices palpitations a couple of times/ week and it usually occurs at nighttime. Denies SOB, cough, chest pain, dizziness, difficulty sleeping or weight gain.   Has been out of losartan for the last 2 days as he needed a  refill.  ROS: All systems negative except as listed in HPI, PMH and Problem List.  SH:  Social History   Socioeconomic History   Marital status: Married    Spouse name: Not on file   Number of children: Not on file   Years of education: Not on file   Highest education level: Not on file  Occupational History   Not on file  Tobacco Use   Smoking status: Never   Smokeless tobacco: Never  Vaping Use   Vaping Use: Never used  Substance and Sexual Activity   Alcohol use: Never   Drug use: Not on file   Sexual activity: Not on file  Other Topics Concern   Not on file  Social History Narrative   Not on file   Social Determinants of Health   Financial Resource Strain: Not on file  Food Insecurity: No Food Insecurity (08/06/2022)   Hunger Vital Sign    Worried About Running Out of Food in the Last Year: Never true    Ran Out of Food in the Last Year: Never true  Transportation Needs: No Transportation Needs (08/06/2022)   PRAPARE - Administrator, Civil Service (Medical): No    Lack of Transportation (Non-Medical): No  Physical Activity: Not on file  Stress: Not on file  Social Connections: Unknown (08/06/2022)   Social Connection and Isolation Panel [NHANES]    Frequency of Communication with Friends and Family: More than three times a week    Frequency of Social Gatherings with Friends and Family: More than  three times a week    Attends Religious Services: 1 to 4 times per year    Active Member of Clubs or Organizations: No    Attends Banker Meetings: Never    Marital Status: Patient declined  Intimate Partner Violence: Not At Risk (08/06/2022)   Humiliation, Afraid, Rape, and Kick questionnaire    Fear of Current or Ex-Partner: No    Emotionally Abused: No    Physically Abused: No    Sexually Abused: No    FH: No family history on file.  Past Medical History:  Diagnosis Date   CHF (congestive heart failure) (HCC)    Coronary artery disease      Current Outpatient Medications  Medication Sig Dispense Refill   apixaban (ELIQUIS) 5 MG TABS tablet Take 1 tablet (5 mg total) by mouth 2 (two) times daily. 60 tablet 11   atorvastatin (LIPITOR) 80 MG tablet Take 1 tablet (80 mg total) by mouth daily. 90 tablet 3   clopidogrel (PLAVIX) 75 MG tablet Take 1 tablet (75 mg total) by mouth daily. 90 tablet 3   clopidogrel (PLAVIX) 75 MG tablet Take 1 tablet (75 mg total) by mouth daily. 30 tablet 0   losartan (COZAAR) 25 MG tablet Take 1 tablet (25 mg total) by mouth daily. 90 tablet 0   Multiple Vitamin (MULTIVITAMIN WITH MINERALS) TABS tablet Take 1 tablet by mouth daily.     pantoprazole (PROTONIX) 40 MG tablet Take 1 tablet (40 mg total) by mouth daily. 90 tablet 3   No current facility-administered medications for this visit.   Vitals:   11/02/22 1338  BP: 110/78  Pulse: 81  SpO2: 100%  Weight: 107 lb 12.8 oz (48.9 kg)   Wt Readings from Last 3 Encounters:  11/02/22 107 lb 12.8 oz (48.9 kg)  09/14/22 107 lb 12.8 oz (48.9 kg)  08/31/22 107 lb 3.2 oz (48.6 kg)   Lab Results  Component Value Date   CREATININE 0.82 09/14/2022   CREATININE 0.87 08/15/2022   CREATININE 0.81 08/10/2022   PHYSICAL EXAM:  General:  Well appearing. No resp difficulty HEENT: normal Neck: supple. JVP flat. No lymphadenopathy or thryomegaly appreciated. Cor: PMI normal. Regular rate & rhythm. No rubs, gallops or murmurs. Lungs: clear Abdomen: soft, nontender, nondistended. No hepatosplenomegaly. Good bowel sounds. Extremities: no cyanosis, clubbing, rash, edema Neuro: alert & orientedx3, cranial nerves grossly intact. Moves all 4 extremities w/o difficulty. Affect pleasant.   ECG: not done   ASSESSMENT & PLAN:  1: Ischemic heart failure with reduced ejection fraction- - likely CAD with cath results showing stenosis - NYHA class II - euvolemic - weighing daily; reminded tocall for an overnight weight gain of > 2 pounds or a weekly weight  gain of > 5 pounds - weight unchanged from last visit here 2 months ago - echo 08/06/22: EF 40-45% - CRT-P placed 08/17/22 - adds "very little" salt to his food - begin metoprolol succinate 25mg  daily as this may also help w/ his palpitations - discussed resuming losartan at next visit - BNP 08/26/22 was 191.1 - PharmD reconciled meds with patient  2: CAD- - STEMI thought to be due to plaque rupture   Prox RCA lesion is 40% stenosed.   Ost LM to Mid LM lesion is 95% stenosed.   A drug-eluting stent was successfully placed using a STENT ONYX FRONTIER 3.5X12.   Post intervention, there is a 0% residual stenosis.   There is severe left ventricular systolic dysfunction.  LV end diastolic pressure is mildly elevated.   The left ventricular ejection fraction is less than 25% by visual estimate.  Severe ostial left main stenosis with hazy appearance highly suggestive of acute plaque rupture.  No other obstructive disease. Severely reduced LV systolic function with an EF of 20% with akinesia of the mid to distal anterior, apical and infero-Apical myocardium. Successful angioplasty and drug-eluting stent placement to the ostial left main coronary artery. - saw cardiology Craig Reid) 04/24 - continue atorvastatin 80mg  daily - continue plavix 75mg  daily  3: Syncope- - saw EP Craig Reid) 08/15/22 - sees EP Craig Reid) 11/20/22 - BIV pacemaker CRT-P placed 08/17/22 - BMP 09/14/22 showed sodium 137, potassium 4.1, creatinine 0.82 & GFR >60 - Mg 08/10/22 was 4.0  4: Paroxysmal Atrial fibrillation- - previous zio showed atrial fibrillation - continue apixaban 5mg  BID  Return in 6 weeks, sooner if needed

## 2022-11-02 NOTE — Patient Instructions (Signed)
Start metoprolol as 1 tablet every morning.

## 2022-11-02 NOTE — Progress Notes (Signed)
[facility] FAILURE CLINIC - Pharmacist Note  Craig Reid is a 62 y.o. 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 Patient is here with his niece today. He takes his weight daily and reports that it is stable. He reports no signs or symptoms of volume overload. He reports no adverse effects of his current regimen and no medication access issues. Patient states that he would like to avoid new medications unless they are necessary to prolong his life.   Recent ED Visit (past 6 months):  Date: 08/05/2022, CC: chest pain  Guideline-Directed Medical Therapy/Evidence Based Medicine ACE/ARB/ARNI: none (ran out of losartan 25 mg 10/31/2022) Beta Blocker:  none Aldosterone Antagonist: none Diuretic:  none SGLT2i:  none  Adherence Assessment Do you ever forget to take your medication? [] Yes [x] No  Do you ever skip doses due to side effects? [] Yes [x] No  Do you have trouble affording your medicines? [] Yes [x] No  Are you ever unable to pick up your medication due to transportation difficulties? [] Yes [x] No  Do you ever stop taking your medications because you don't believe they are helping? [] Yes [x] No  Do you check your weight daily? [x] Yes [] No  Adherence strategy: pill box Barriers to obtaining medications: none reported   Diagnostics ECHO: Date 08/06/2022, EF 40-45%, [facility]  Vitals    11/02/2022    1:38 PM 09/14/2022   10:40 AM 08/31/2022    2:38 PM  Vitals with BMI  Height  5\' 2"    Weight 107 lbs 13 oz 107 lbs 13 oz 107 lbs 3 oz  BMI  19.71 19.6  Systolic 110 118 161  Diastolic 78 80 78  Pulse 81 75 98     Recent Labs    Latest Ref Rng & Units 09/14/2022   11:50 AM 08/15/2022   12:28 PM 08/10/2022    3:09 AM  BMP  Glucose 70 - 99 mg/dL 95  96  93   BUN 6 - 20 mg/dL 22  26  27    Creatinine 0.61 - 1.24 mg/dL 0.96  0.45  4.09   Sodium 135 - 145 mmol/L 137  135  135   Potassium 3.5 - 5.1 mmol/L 4.1  3.6  3.6   Chloride 98 - 111 mmol/L  100  105  102   CO2 22 - 32 mmol/L 27  25  23    Calcium 8.9 - 10.3 mg/dL 9.5  9.5  9.3     Past Medical History Past Medical History:  Diagnosis Date   CHF (congestive heart failure) (HCC)    Coronary artery disease     Plan Continue regimen as directed by NP Add metoprolol succinate 25 mg daily for palpitations Consider addition of GDMT as BP allows and per patient preference Annual echo due 08/2023  Time spent: 10 minutes  Celene Squibb, PharmD PGY1 Pharmacy Resident 11/02/2022 2:07 PM

## 2022-11-15 NOTE — Progress Notes (Signed)
Cardiology Office Note    Date:  11/16/2022   ID:  Dwain Sarna, DOB 10-Aug-1960, MRN [id number]  PCP:  [name], FNP  Cardiologist:  Lorine Bears, MD  Electrophysiologist:  Lanier Prude, MD   Chief Complaint: Follow up  History of Present Illness:   Craig Reid is a 62 y.o. male with history of CAD with an anterior STEMI s/p PCI/DES to the left main in 08/2022, HFrEF secondary ICM, syncope with complete heart block s/p BiV PPM in 08/2022, PAF on Eliquis, HTN, and HLD who presents for follow-up of his CAD, cardiomyopathy, CHB, and PAF.  He was admitted to [facility] in 08/2022 following a syncopal episode at work with a hospital CPR 1 minute gaining consciousness.  Upon EMS arrival EKG showed anterior ST elevation MI.  In the ED, evidence of bradycardia with intermittent high-grade AV block.  Emergent LHC showed severe ostial left main stenosis with hazy appearance highly suggestive of acute plaque rupture.  Otherwise, there was no other obstructive disease.  There was severely reduced LV systolic function with an EF of 20% with akinesia of the mid to distal anterior, apical, and inferoapical myocardium.  He underwent successful PCI/DES to the ostial left main.  During the admission, he was evaluated by EP given bradycardia and high-grade AV block with 2-1 conduction with recommendation for for continued monitoring following PCI.  Subsequent outpatient cardiac monitor showed an average rate of 63 bpm with a range of 28 to 154 bpm, 1 run of NSVT lasting 7 beats, 7% A-fib/flutter burden with the longest episode lasting 9 hours and 15 minutes, 6226 episodes of high degree/third-degree AV block.  Patient subsequently underwent CRT-P on 08/17/2022.  After discussion with interventional cardiology and EP, the patient was subsequently placed on apixaban with transition from Effient to clopidogrel and discontinuation of aspirin given documented A-fib on outpatient cardiac monitor.  At  his last visit with the CHF clinic he had been out of losartan and was started on Toprol-XL.  Patient and family prefer to do defer reinitiation of ARB given concerns for hypotension.  He comes in accompanied by family member today who provides medical interpretation.  He is without symptoms of angina or cardiac decompensation.  No dizziness, presyncope, or syncope.  He does continue to note some intermittent palpitations without associated symptoms.  No lower extremity swelling, abdominal distention, or progressive orthopnea.  He is watching his salt and fluid intake.  Weight remains stable.  He remains adherent to apixaban and clopidogrel and has been without falls, hematochezia, or melena.  Tolerating metoprolol without issues.  Overall, he is doing well and does not have any acute cardiac concerns at this time.   Labs independently reviewed: 10/2022 - Hgb 13.1, PLT 183 09/2022 - potassium 4.1, BUN 22, serum creatinine 0.82, albumin 4.4, AST/ALT normal 08/2022 - magnesium 2.1, TSH normal, free T4 normal, LP(a) 99.8, A1c 5.0, TC 155, TG 78, HDL 58, LDL 81  Past Medical History:  Diagnosis Date   Complete heart block (HCC)    a. BiV PPM 3/24   Coronary artery disease    a. ant STEMI 3/24 ost LM 95% s/p PCI/DES to LM, pRCA 40%   Essential hypertension    Hyperlipidemia LDL goal <50    Ischemic cardiomyopathy    PAF (paroxysmal atrial fibrillation) (HCC)     Past Surgical History:  Procedure Laterality Date   BIV PACEMAKER INSERTION CRT-P N/A 08/17/2022   Procedure: BIV PACEMAKER INSERTION CRT-P;  Surgeon: Graciela Husbands,  Salvatore Decent, MD;  Location: [facility] INVASIVE CV LAB;  Service: Cardiovascular;  Laterality: N/A;   CORONARY/GRAFT ACUTE MI REVASCULARIZATION N/A 08/05/2022   Procedure: Coronary/Graft Acute MI Revascularization;  Surgeon: Iran Ouch, MD;  Location: [facility] INVASIVE CV LAB;  Service: Cardiovascular;  Laterality: N/A;   LAPAROTOMY N/A 04/21/2019   Procedure: EXPLORATORY LAPAROTOMY,  gastric ulcer repair;  Surgeon: Sung Amabile, DO;  Location: [facility] ORS;  Service: General;  Laterality: N/A;   LEFT HEART CATH AND CORONARY ANGIOGRAPHY N/A 08/05/2022   Procedure: LEFT HEART CATH AND CORONARY ANGIOGRAPHY;  Surgeon: Iran Ouch, MD;  Location: [facility] INVASIVE CV LAB;  Service: Cardiovascular;  Laterality: N/A;    Current Medications: Current Meds  Medication Sig   apixaban (ELIQUIS) 5 MG TABS tablet Take 1 tablet (5 mg total) by mouth 2 (two) times daily.   atorvastatin (LIPITOR) 80 MG tablet Take 1 tablet (80 mg total) by mouth daily.   clopidogrel (PLAVIX) 75 MG tablet Take 1 tablet (75 mg total) by mouth daily.   melatonin 5 MG TABS Take 5-10 mg by mouth at bedtime as needed.   metoprolol succinate (TOPROL XL) 25 MG 24 hr tablet Take 1 tablet (25 mg total) by mouth daily.   Multiple Vitamin (MULTIVITAMIN WITH MINERALS) TABS tablet Take 1 tablet by mouth daily.   pantoprazole (PROTONIX) 40 MG tablet Take 1 tablet (40 mg total) by mouth daily.    Allergies:   Patient has no known allergies.   Social History   Socioeconomic History   Marital status: Married    Spouse name: Not on file   Number of children: Not on file   Years of education: Not on file   Highest education level: Not on file  Occupational History   Not on file  Tobacco Use   Smoking status: Never   Smokeless tobacco: Never  Vaping Use   Vaping Use: Never used  Substance and Sexual Activity   Alcohol use: Never   Drug use: Not on file   Sexual activity: Not on file  Other Topics Concern   Not on file  Social History Narrative   Not on file   Social Determinants of Health   Financial Resource Strain: Not on file  Food Insecurity: No Food Insecurity (08/06/2022)   Hunger Vital Sign    Worried About Running Out of Food in the Last Year: Never true    Ran Out of Food in the Last Year: Never true  Transportation Needs: No Transportation Needs (08/06/2022)   PRAPARE - Doctor, general practice (Medical): No    Lack of Transportation (Non-Medical): No  Physical Activity: Not on file  Stress: Not on file  Social Connections: Unknown (08/06/2022)   Social Connection and Isolation Panel [NHANES]    Frequency of Communication with Friends and Family: More than three times a week    Frequency of Social Gatherings with Friends and Family: More than three times a week    Attends Religious Services: 1 to 4 times per year    Active Member of [employer] or Organizations: No    Attends Banker Meetings: Never    Marital Status: Patient declined     Family History:  The patient's family history is not on file.  ROS:   12-point review of systems is negative unless otherwise noted in the HPI.   EKGs/Labs/Other Studies Reviewed:    Studies reviewed were summarized above. The additional studies were reviewed today:  Zio  patch 08/2022: HR 28 - 154 bpm, average 63 bpm. 1 nonsustained VT lasting 7 beats. 7% AF/AFL burden, longest episode lasting 9hr32min. 6226 episodes of high degree/3rd degree AV block. Rare supraventricular ectopy. Rare ventricular ectopy.   Patient is s/p PPM with Dr Graciela Husbands. __________  2D echo 08/06/2022: 1. Left ventricular ejection fraction, by estimation, is 40 to 45%. The  left ventricle has mildly decreased function. The left ventricle  demonstrates global hypokinesis. Left ventricular diastolic parameters are  indeterminate.   2. Right ventricular systolic function is normal. The right ventricular  size is normal. There is normal pulmonary artery systolic pressure.   3. The mitral valve is normal in structure. Mild mitral valve  regurgitation. No evidence of mitral stenosis.   4. The aortic valve is normal in structure. Aortic valve regurgitation is  trivial. Aortic valve sclerosis is present, with no evidence of aortic  valve stenosis.  __________  LHC 08/05/2022:   Prox RCA lesion is 40% stenosed.   Ost LM to Mid LM lesion is  95% stenosed.   A drug-eluting stent was successfully placed using a STENT ONYX FRONTIER 3.5X12.   Post intervention, there is a 0% residual stenosis.   There is severe left ventricular systolic dysfunction.   LV end diastolic pressure is mildly elevated.   The left ventricular ejection fraction is less than 25% by visual estimate.   1.  Severe ostial left main stenosis with hazy appearance highly suggestive of acute plaque rupture.  No other obstructive disease. 2.  Severely reduced LV systolic function with an EF of 20% with akinesia of the mid to distal anterior, apical and infero-Apical myocardium. 3.  Successful angioplasty and drug-eluting stent placement to the ostial left main coronary artery.   Recommendations: Dual antiplatelet therapy for at least 12 months. No beta-blockers for now due to bradycardia.  Monitor heart rate and blood pressure closely as the patient is at risk for decompensation given degree of myocardium at risk. Delays and door to device time related to language barrier and the difficulty of obtaining an interpreter.   EKG:  EKG is ordered today.  The EKG ordered today demonstrates A-sensed V-paced rhythm with prolonged AV conduction, 69 bpm  Recent Labs: 08/06/2022: B Natriuretic Peptide 191.1 08/07/2022: TSH 2.107 08/10/2022: Magnesium 2.1 09/14/2022: ALT 32; BUN 22; Creatinine, Ser 0.82; Potassium 4.1; Sodium 137 10/19/2022: Hemoglobin 13.1; Platelets 183  Recent Lipid Panel    Component Value Date/Time   CHOL 155 08/05/2022 2145   TRIG 78 08/05/2022 2145   HDL 58 08/05/2022 2145   CHOLHDL 2.7 08/05/2022 2145   VLDL 16 08/05/2022 2145   LDLCALC 81 08/05/2022 2145    PHYSICAL EXAM:    VS:  BP 111/74   Pulse 69   Ht 5\' 3"  (1.6 m)   Wt 107 lb 3.2 oz (48.6 kg)   SpO2 97%   BMI 18.99 kg/m   BMI: Body mass index is 18.99 kg/m.  Physical Exam Vitals reviewed.  Constitutional:      Appearance: He is well-developed.  HENT:     Head: Normocephalic and  atraumatic.  Eyes:     General:        Right eye: No discharge.        Left eye: No discharge.  Neck:     Vascular: No JVD.  Cardiovascular:     Rate and Rhythm: Normal rate and regular rhythm.     Pulses:          Posterior  tibial pulses are 2+ on the right side and 2+ on the left side.     Heart sounds: Normal heart sounds, S1 normal and S2 normal. Heart sounds not distant. No midsystolic click and no opening snap. No murmur heard.    No friction rub.  Pulmonary:     Effort: Pulmonary effort is normal. No respiratory distress.     Breath sounds: Normal breath sounds. No decreased breath sounds, wheezing or rales.  Chest:     Chest wall: No tenderness.  Abdominal:     General: There is no distension.  Musculoskeletal:     Cervical back: Normal range of motion.     Right lower leg: No edema.     Left lower leg: No edema.  Skin:    General: Skin is warm and dry.     Nails: There is no clubbing.  Neurological:     Mental Status: He is alert and oriented to person, place, and time.  Psychiatric:        Speech: Speech normal.        Behavior: Behavior normal.        Thought Content: Thought content normal.        Judgment: Judgment normal.     Wt Readings from Last 3 Encounters:  11/16/22 107 lb 3.2 oz (48.6 kg)  11/02/22 107 lb 12.8 oz (48.9 kg)  09/14/22 107 lb 12.8 oz (48.9 kg)     ASSESSMENT & PLAN:   CAD involving the native coronary arteries without angina: He is doing well and without symptoms of angina.  Now on apixaban and clopidogrel given documented A-fib on outpatient cardiac monitoring with recommendation to continue clopidogrel through at least 08/2023 with consideration for continuation of clopidogrel versus transitioning to aspirin at that time and given history of left main stent.  Continue aggressive risk factor modification including atorvastatin and metoprolol.  No indication for further ischemic testing at this time.  HFrEF secondary to ICM: Euvolemic  and well compensated with NYHA class II symptoms.  No longer on losartan as he ran out of this approximately 2 days before his CHF appointment at the end of May.  Patient and family preferred to defer reinitiation of ARB given he was a started on Toprol-XL at that time.  They are concerned about lowering his blood pressure too much.  BP is currently stable in the office.  We will obtain an echo to evaluate for improvement in his cardiomyopathy following PCI.  Should his cardiomyopathy persist, would look to escalate GDMT as tolerated.  Not currently requiring a standing loop diuretic.  PAF: A-sensed, V-paced.  On metoprolol.  CHA2DS2-VASc at least 3 (CHF, HTN, vascular disease).  He remains on apixaban 5 mg twice daily and does not meet reduced dosing criteria.  No symptoms concerning for bleeding.  Recent Hgb, renal function, and potassium stable.  Complete heart block: Status post CRT-P in 08/2022.  Follow-up with EP as directed with appointment scheduled for 6/18.  HTN: Blood pressure is well-controlled in the office today.  Continue medical therapy as outlined above.  HLD: LDL 81 with goal being less than 55.  Remains on atorvastatin 80 mg.  Check lipid panel, direct LDL, and AST/ALT.  Recommend escalating lipid-lowering therapy as indicated to achieve target LDL.  Language barrier: Patient declined medical interpreter.  Family member, who works in healthcare, provided interpretation for today's visit at his request.    Disposition: F/u with Dr. Kirke Corin or an APP in 3 months, and  EP as directed.   Medication Adjustments/Labs and Tests Ordered: Current medicines are reviewed at length with the patient today.  Concerns regarding medicines are outlined above. Medication changes, Labs and Tests ordered today are summarized above and listed in the Patient Instructions accessible in Encounters.   Signed, Eula Listen, PA-C 11/16/2022 12:25 PM     [facility] [place] [address] [place], [place] [address] [phone]

## 2022-11-16 ENCOUNTER — Encounter: Payer: Self-pay | Admitting: Physician Assistant

## 2022-11-16 ENCOUNTER — Ambulatory Visit: Payer: 59 | Attending: Physician Assistant | Admitting: Physician Assistant

## 2022-11-16 ENCOUNTER — Other Ambulatory Visit
Admission: RE | Admit: 2022-11-16 | Discharge: 2022-11-16 | Disposition: A | Discharge status: Home / Self Care | Payer: 59 | Source: Ambulatory Visit | Attending: Physician Assistant | Admitting: Physician Assistant

## 2022-11-16 ENCOUNTER — Telehealth: Payer: Self-pay | Admitting: *Deleted

## 2022-11-16 VITALS — BP 111/74 | HR 69 | Ht 63.0 in | Wt 107.2 lb

## 2022-11-16 DIAGNOSIS — I10 Essential (primary) hypertension: Secondary | ICD-10-CM | POA: Diagnosis not present

## 2022-11-16 DIAGNOSIS — Z950 Presence of cardiac pacemaker: Secondary | ICD-10-CM | POA: Diagnosis not present

## 2022-11-16 DIAGNOSIS — I5020 Unspecified systolic (congestive) heart failure: Secondary | ICD-10-CM | POA: Diagnosis not present

## 2022-11-16 DIAGNOSIS — I2510 Atherosclerotic heart disease of native coronary artery without angina pectoris: Secondary | ICD-10-CM

## 2022-11-16 DIAGNOSIS — I480 Paroxysmal atrial fibrillation: Secondary | ICD-10-CM

## 2022-11-16 DIAGNOSIS — E785 Hyperlipidemia, unspecified: Secondary | ICD-10-CM

## 2022-11-16 DIAGNOSIS — Z758 Other problems related to medical facilities and other health care: Secondary | ICD-10-CM

## 2022-11-16 DIAGNOSIS — I442 Atrioventricular block, complete: Secondary | ICD-10-CM

## 2022-11-16 DIAGNOSIS — I2101 ST elevation (STEMI) myocardial infarction involving left main coronary artery: Secondary | ICD-10-CM

## 2022-11-16 DIAGNOSIS — Z955 Presence of coronary angioplasty implant and graft: Secondary | ICD-10-CM | POA: Insufficient documentation

## 2022-11-16 DIAGNOSIS — I255 Ischemic cardiomyopathy: Secondary | ICD-10-CM

## 2022-11-16 DIAGNOSIS — Z603 Acculturation difficulty: Secondary | ICD-10-CM

## 2022-11-16 LAB — LDL CHOLESTEROL, DIRECT: Direct LDL: 41 mg/dL (ref 0–99)

## 2022-11-16 LAB — LIPID PANEL
Cholesterol: 107 mg/dL (ref 0–200)
HDL: 51 mg/dL (ref >40)
LDL Cholesterol: 39 mg/dL (ref 0–99)
Total CHOL/HDL Ratio: 2.1 RATIO
Triglycerides: 87 mg/dL (ref <150)
VLDL: 17 mg/dL (ref 0–40)

## 2022-11-16 LAB — AST: AST: 35 U/L (ref 15–41)

## 2022-11-16 LAB — ALT: ALT: 39 U/L (ref 0–44)

## 2022-11-16 NOTE — Telephone Encounter (Signed)
Left detailed voicemail message that results were normal and to continue current medications with instructions to call back if any further questions.

## 2022-11-16 NOTE — Patient Instructions (Signed)
Medication Instructions:  No changes a this time.   *If you need a refill on your cardiac medications before your next appointment, please call your pharmacy*   Lab Work: Lipid, DLDL, AST, ALT to be done today over at [facility] mall check in at registration.   If you have labs (blood work) drawn today and your tests are completely normal, you will receive your results only by: MyChart Message (if you have MyChart) OR A paper copy in the mail If you have any lab test that is abnormal or we need to change your treatment, we will call you to review the results.   Testing/Procedures: Your physician has requested that you have an echocardiogram. Echocardiography is a painless test that uses sound waves to create images of your heart. It provides your doctor with information about the size and shape of your heart and how well your heart's chambers and valves are working. This procedure takes approximately one hour. There are no restrictions for this procedure. Please do NOT wear cologne, perfume, aftershave, or lotions (deodorant is allowed). Please arrive 15 minutes prior to your appointment time.    Follow-Up: At [facility], you and your health needs are our priority.  As part of our continuing mission to provide you with exceptional heart care, we have created designated Provider Care Teams.  These Care Teams include your primary Cardiologist (physician) and Advanced Practice Providers (APPs -  Physician Assistants and Nurse Practitioners) who all work together to provide you with the care you need, when you need it.  We recommend signing up for the patient portal called "MyChart".  Sign up information is provided on this After Visit Summary.  MyChart is used to connect with patients for Virtual Visits (Telemedicine).  Patients are able to view lab/test results, encounter notes, upcoming appointments, etc.  Non-urgent messages can be sent to your provider as well.   To learn more  about what you can do with MyChart, go to [email or web address].    Your next appointment:   3 month(s)  Provider:   Lorine Bears, MD or Eula Listen, PA-C

## 2022-11-16 NOTE — Telephone Encounter (Signed)
-----   Message from Sondra Barges, PA-C sent at 11/16/2022 12:58 PM EDT ----- Cholesterol well-controlled and at goal, liver function normal.  Continue current dose of atorvastatin.

## 2022-11-19 ENCOUNTER — Ambulatory Visit (INDEPENDENT_AMBULATORY_CARE_PROVIDER_SITE_OTHER): Payer: 59

## 2022-11-19 DIAGNOSIS — I255 Ischemic cardiomyopathy: Secondary | ICD-10-CM | POA: Diagnosis not present

## 2022-11-19 DIAGNOSIS — I480 Paroxysmal atrial fibrillation: Secondary | ICD-10-CM

## 2022-11-20 ENCOUNTER — Ambulatory Visit: Payer: 59 | Attending: Internal Medicine | Admitting: Internal Medicine

## 2022-11-20 ENCOUNTER — Encounter: Payer: Self-pay | Admitting: Internal Medicine

## 2022-11-20 VITALS — BP 110/80 | HR 72 | Ht 63.0 in | Wt 105.0 lb

## 2022-11-20 DIAGNOSIS — I442 Atrioventricular block, complete: Secondary | ICD-10-CM

## 2022-11-20 LAB — CUP PACEART REMOTE DEVICE CHECK
Battery Remaining Longevity: 117 mo
Battery Voltage: 3.03 V
Brady Statistic AP VP Percent: 5.92 %
Brady Statistic AP VS Percent: 0.02 %
Brady Statistic AS VP Percent: 93.73 %
Brady Statistic AS VS Percent: 0.33 %
Brady Statistic RA Percent Paced: 6.06 %
Brady Statistic RV Percent Paced: 99.65 %
Date Time Interrogation Session: 20240616211316
Implantable Lead Connection Status: 753985
Implantable Lead Connection Status: 753985
Implantable Lead Connection Status: 753985
Implantable Lead Implant Date: 20240315
Implantable Lead Implant Date: 20240315
Implantable Lead Implant Date: 20240315
Implantable Lead Location: 753859
Implantable Lead Location: 753860
Implantable Lead Location: 753860
Implantable Lead Model: 3830
Implantable Lead Model: 5076
Implantable Lead Model: 5076
Implantable Pulse Generator Implant Date: 20240315
Lead Channel Impedance Value: 228 Ohm
Lead Channel Impedance Value: 323 Ohm
Lead Channel Impedance Value: 323 Ohm
Lead Channel Impedance Value: 323 Ohm
Lead Channel Impedance Value: 380 Ohm
Lead Channel Impedance Value: 380 Ohm
Lead Channel Impedance Value: 399 Ohm
Lead Channel Impedance Value: 475 Ohm
Lead Channel Impedance Value: 494 Ohm
Lead Channel Pacing Threshold Amplitude: 0.5 V
Lead Channel Pacing Threshold Amplitude: 0.625 V
Lead Channel Pacing Threshold Amplitude: 1.125 V
Lead Channel Pacing Threshold Pulse Width: 0.4 ms
Lead Channel Pacing Threshold Pulse Width: 0.4 ms
Lead Channel Pacing Threshold Pulse Width: 0.4 ms
Lead Channel Sensing Intrinsic Amplitude: 1.375 mV
Lead Channel Sensing Intrinsic Amplitude: 1.375 mV
Lead Channel Sensing Intrinsic Amplitude: 9.75 mV
Lead Channel Sensing Intrinsic Amplitude: 9.75 mV
Lead Channel Setting Pacing Amplitude: 1.5 V
Lead Channel Setting Pacing Amplitude: 1.75 V
Lead Channel Setting Pacing Amplitude: 2 V
Lead Channel Setting Pacing Pulse Width: 0.4 ms
Lead Channel Setting Pacing Pulse Width: 0.4 ms
Lead Channel Setting Sensing Sensitivity: 1.2 mV
Zone Setting Status: 755011
Zone Setting Status: 755011

## 2022-11-20 LAB — CUP PACEART INCLINIC DEVICE CHECK
Battery Remaining Longevity: 123 mo
Battery Voltage: 3.03 V
Brady Statistic AP VP Percent: 5.98 %
Brady Statistic AP VS Percent: 0.02 %
Brady Statistic AS VP Percent: 93.65 %
Brady Statistic AS VS Percent: 0.35 %
Brady Statistic RA Percent Paced: 6.12 %
Brady Statistic RV Percent Paced: 99.63 %
Date Time Interrogation Session: 20240618131814
Implantable Lead Connection Status: 753985
Implantable Lead Connection Status: 753985
Implantable Lead Connection Status: 753985
Implantable Lead Implant Date: 20240315
Implantable Lead Implant Date: 20240315
Implantable Lead Implant Date: 20240315
Implantable Lead Location: 753859
Implantable Lead Location: 753860
Implantable Lead Location: 753860
Implantable Lead Model: 3830
Implantable Lead Model: 5076
Implantable Lead Model: 5076
Implantable Pulse Generator Implant Date: 20240315
Lead Channel Impedance Value: 228 Ohm
Lead Channel Impedance Value: 342 Ohm
Lead Channel Impedance Value: 361 Ohm
Lead Channel Impedance Value: 380 Ohm
Lead Channel Impedance Value: 418 Ohm
Lead Channel Impedance Value: 418 Ohm
Lead Channel Impedance Value: 475 Ohm
Lead Channel Impedance Value: 532 Ohm
Lead Channel Impedance Value: 551 Ohm
Lead Channel Pacing Threshold Amplitude: 0.5 V
Lead Channel Pacing Threshold Amplitude: 0.625 V
Lead Channel Pacing Threshold Amplitude: 1 V
Lead Channel Pacing Threshold Pulse Width: 0.4 ms
Lead Channel Pacing Threshold Pulse Width: 0.4 ms
Lead Channel Pacing Threshold Pulse Width: 0.4 ms
Lead Channel Sensing Intrinsic Amplitude: 0.625 mV
Lead Channel Sensing Intrinsic Amplitude: 1 mV
Lead Channel Sensing Intrinsic Amplitude: 8.875 mV
Lead Channel Sensing Intrinsic Amplitude: 9.75 mV
Lead Channel Setting Pacing Amplitude: 1.5 V
Lead Channel Setting Pacing Amplitude: 1.5 V
Lead Channel Setting Pacing Amplitude: 2 V
Lead Channel Setting Pacing Pulse Width: 0.4 ms
Lead Channel Setting Pacing Pulse Width: 0.4 ms
Lead Channel Setting Sensing Sensitivity: 1.2 mV
Zone Setting Status: 755011
Zone Setting Status: 755011

## 2022-11-20 MED ORDER — LOSARTAN POTASSIUM 25 MG PO TABS: 25.0000 mg | ORAL_TABLET | Freq: Every day | ORAL | 3 refills | Status: DC

## 2022-11-20 NOTE — Progress Notes (Signed)
Patient Care Team: [name], FNP as PCP - General (Family Medicine) Iran Ouch, MD as PCP - Cardiology (Cardiology) Lanier Prude, MD as PCP - Electrophysiology (Cardiology)   HPI  Craig Reid is a 62 y.o. male seen in followup for LBBarea pacing (DOI 3/24 Medtronic ) high grade heart block in setting of ischemic heart disease with syncope   Much improved exercise tolerance.  No dyspnea.  Some orthopnea-two-pillow-chronic.  No edema.  No chest pain.  Some stiffness with his left shoulder  Metoprolol recently initiated and losartan put on hold by the heart failure clinic  Date Cr K Hgb  4/24 0.82 4.1 13.1         DATE TEST EF   3/24 LHC  25 % LMo-m 95%>>stent  3/24 Echo   40-45 %                 Records and Results Reviewed   Past Medical History:  Diagnosis Date   Complete heart block (HCC)    a. BiV PPM 3/24   Coronary artery disease    a. ant STEMI 3/24 ost LM 95% s/p PCI/DES to LM, pRCA 40%   Essential hypertension    Hyperlipidemia LDL goal <50    Ischemic cardiomyopathy    PAF (paroxysmal atrial fibrillation) (HCC)     Past Surgical History:  Procedure Laterality Date   BIV PACEMAKER INSERTION CRT-P N/A 08/17/2022   Procedure: BIV PACEMAKER INSERTION CRT-P;  Surgeon: Duke Salvia, MD;  Location: [facility] INVASIVE CV LAB;  Service: Cardiovascular;  Laterality: N/A;   CORONARY/GRAFT ACUTE MI REVASCULARIZATION N/A 08/05/2022   Procedure: Coronary/Graft Acute MI Revascularization;  Surgeon: Iran Ouch, MD;  Location: [facility] INVASIVE CV LAB;  Service: Cardiovascular;  Laterality: N/A;   LAPAROTOMY N/A 04/21/2019   Procedure: EXPLORATORY LAPAROTOMY, gastric ulcer repair;  Surgeon: Sung Amabile, DO;  Location: [facility] ORS;  Service: General;  Laterality: N/A;   LEFT HEART CATH AND CORONARY ANGIOGRAPHY N/A 08/05/2022   Procedure: LEFT HEART CATH AND CORONARY ANGIOGRAPHY;  Surgeon: Iran Ouch, MD;  Location: [facility]  INVASIVE CV LAB;  Service: Cardiovascular;  Laterality: N/A;    Current Meds  Medication Sig   apixaban (ELIQUIS) 5 MG TABS tablet Take 1 tablet (5 mg total) by mouth 2 (two) times daily.   atorvastatin (LIPITOR) 80 MG tablet Take 1 tablet (80 mg total) by mouth daily.   clopidogrel (PLAVIX) 75 MG tablet Take 1 tablet (75 mg total) by mouth daily.   losartan (COZAAR) 25 MG tablet Take 1 tablet (25 mg total) by mouth at bedtime.   melatonin 5 MG TABS Take 5-10 mg by mouth at bedtime as needed.   metoprolol succinate (TOPROL XL) 25 MG 24 hr tablet Take 1 tablet (25 mg total) by mouth daily.   Multiple Vitamin (MULTIVITAMIN WITH MINERALS) TABS tablet Take 1 tablet by mouth daily.   pantoprazole (PROTONIX) 40 MG tablet Take 1 tablet (40 mg total) by mouth daily.    No Known Allergies    Review of Systems negative except from HPI and PMH  Physical Exam BP 110/80 (BP Location: Left Arm, Patient Position: Sitting, Cuff Size: Normal)   Pulse 72   Ht 5\' 3"  (1.6 m)   Wt 105 lb (47.6 kg)   SpO2 99%   BMI 18.60 kg/m  Well developed and well nourished in no acute distress HENT normal E scleral and icterus clear Neck Supple JVP flat;  carotids brisk and full Clear to ausculation Regular rate and rhythm, 2/6 diastolic murmur at the left upper sternal border without significant radiation no gallops or rub Soft with active bowel sounds No clubbing cyanosis  Edema Alert and oriented, grossly normal motor and sensory function Skin Warm and Dry  ECG sinus with P synchronous pacing with a parable of 290 QRS duration of 156 PR prime has been lost in lead V1 occasional PVC     Device function is normal. Programming changes SAV shortened from 230--150 ms See Paceart for details    CrCl cannot be calculated (Patient's most recent lab result is older than the maximum 21 days allowed.).   Assessment and  Plan  Pacemaker LBBarea  Medtronic (rsr" and QRSd 160 msec)   Prolonged PV interval    MI-anterior ST changes acute  ? LM plaque rupture >> DES   Pulseless syncope   High grade block intermittent    Cardiomyopathy  current EF 20% >> 40-45%  \Congestive heart failure-chronic-systolic-class II    Functional status is improved following pacing.  We will shorten the AV delay as noted above.  He is euvolemic.  We will resume him on losartan and have him take it at night so as to avoid excess daytime blood pressure lowering.  Continue metoprolol.  At heart failure clinic would recommend an effort to begin him on spironolactone at 12.5.  From a implant point of view he can return to work.     Current medicines are reviewed at length with the patient today .  The patient does not  have concerns regarding medicines.

## 2022-11-20 NOTE — Patient Instructions (Signed)
Medication Instructions:  Your physician has recommended you make the following change in your medication:  1) RESTART taking losartan 25 mg every night *If you need a refill on your cardiac medications before your next appointment, please call your pharmacy*  Follow-Up: At [facility], you and your health needs are our priority.  As part of our continuing mission to provide you with exceptional heart care, we have created designated Provider Care Teams.  These Care Teams include your primary Cardiologist (physician) and Advanced Practice Providers (APPs -  Physician Assistants and Nurse Practitioners) who all work together to provide you with the care you need, when you need it.  Your next appointment:   9 month(s)  Provider:   Sherryl Manges, MD

## 2022-11-20 NOTE — Addendum Note (Signed)
Addended by: Guerry Minors on: 11/20/2022 11:46 AM   Modules accepted: Orders

## 2022-11-23 ENCOUNTER — Ambulatory Visit: Payer: 59 | Attending: Physician Assistant

## 2022-11-23 DIAGNOSIS — I081 Rheumatic disorders of both mitral and tricuspid valves: Secondary | ICD-10-CM | POA: Diagnosis not present

## 2022-11-23 DIAGNOSIS — I2510 Atherosclerotic heart disease of native coronary artery without angina pectoris: Secondary | ICD-10-CM

## 2022-11-23 DIAGNOSIS — Z955 Presence of coronary angioplasty implant and graft: Secondary | ICD-10-CM | POA: Diagnosis not present

## 2022-11-23 DIAGNOSIS — I5030 Unspecified diastolic (congestive) heart failure: Secondary | ICD-10-CM | POA: Diagnosis not present

## 2022-11-24 LAB — ECHOCARDIOGRAM LIMITED
Area-P 1/2: 4.21 cm2
Calc EF: 44.3 %
Single Plane A2C EF: 50.1 %
Single Plane A4C EF: 44 %

## 2022-11-27 ENCOUNTER — Telehealth: Payer: Self-pay | Admitting: *Deleted

## 2022-11-27 NOTE — Telephone Encounter (Signed)
-----   Message from Sondra Barges, PA-C sent at 11/25/2022  7:15 AM EDT ----- Echo showed a pump function of 40 to 45%, sluggish movement of a portion of the heart walls, slightly stiffened heart, mildly leaky mitral valve, and normal pressure within the heart.  Reduced pump function persists and is unchanged.  Patient now back on ARB.  Otherwise, relative hypotension precludes escalation of GDMT.  Follow-up as scheduled.

## 2022-11-27 NOTE — Telephone Encounter (Signed)
Results and recommendations reviewed and no further questions at this time.

## 2022-11-27 NOTE — Telephone Encounter (Signed)
Left voicemail message to call back for review of results.  

## 2022-12-10 NOTE — Progress Notes (Signed)
Remote pacemaker transmission.   

## 2022-12-21 ENCOUNTER — Ambulatory Visit: Payer: 59 | Attending: Family | Admitting: Family

## 2022-12-21 ENCOUNTER — Encounter: Payer: Self-pay | Admitting: Family

## 2022-12-21 VITALS — BP 110/78 | HR 77 | Ht 63.0 in | Wt 106.0 lb

## 2022-12-21 DIAGNOSIS — Z7902 Long term (current) use of antithrombotics/antiplatelets: Secondary | ICD-10-CM | POA: Diagnosis not present

## 2022-12-21 DIAGNOSIS — Z7901 Long term (current) use of anticoagulants: Secondary | ICD-10-CM | POA: Diagnosis not present

## 2022-12-21 DIAGNOSIS — I255 Ischemic cardiomyopathy: Secondary | ICD-10-CM | POA: Insufficient documentation

## 2022-12-21 DIAGNOSIS — Z955 Presence of coronary angioplasty implant and graft: Secondary | ICD-10-CM | POA: Diagnosis not present

## 2022-12-21 DIAGNOSIS — I252 Old myocardial infarction: Secondary | ICD-10-CM | POA: Insufficient documentation

## 2022-12-21 DIAGNOSIS — I110 Hypertensive heart disease with heart failure: Secondary | ICD-10-CM | POA: Insufficient documentation

## 2022-12-21 DIAGNOSIS — E785 Hyperlipidemia, unspecified: Secondary | ICD-10-CM | POA: Diagnosis not present

## 2022-12-21 DIAGNOSIS — I480 Paroxysmal atrial fibrillation: Secondary | ICD-10-CM | POA: Diagnosis not present

## 2022-12-21 DIAGNOSIS — Z950 Presence of cardiac pacemaker: Secondary | ICD-10-CM | POA: Insufficient documentation

## 2022-12-21 DIAGNOSIS — I2510 Atherosclerotic heart disease of native coronary artery without angina pectoris: Secondary | ICD-10-CM

## 2022-12-21 DIAGNOSIS — I5020 Unspecified systolic (congestive) heart failure: Secondary | ICD-10-CM | POA: Insufficient documentation

## 2022-12-21 DIAGNOSIS — R55 Syncope and collapse: Secondary | ICD-10-CM | POA: Insufficient documentation

## 2022-12-21 DIAGNOSIS — Z79899 Other long term (current) drug therapy: Secondary | ICD-10-CM | POA: Diagnosis not present

## 2022-12-21 MED ORDER — SPIRONOLACTONE 25 MG PO TABS: 12.5000 mg | ORAL_TABLET | Freq: Every day | ORAL | 3 refills | Status: DC

## 2022-12-21 NOTE — Progress Notes (Signed)
PCP: Azzie Roup, FNP  Primary Cardiologist: Lorine Bears, MD (last seen 06/24)  HPI:  Patient deferred interpreter as he used his niece on the phone  Craig Reid is a 62 y/o male with a history of CAD/ STEMI thought to be due to plaque rupture and heart failure, biventricular pacemaker placed on 08/17/22, ICM and HTN.   Echo 08/06/22: EF 40-45% Echo 11/23/22: EF 40-45% along with Grade I DD, mild Craig  LHC 08/05/22:    Prox RCA lesion is 40% stenosed.   Ost LM to Mid LM lesion is 95% stenosed.   A drug-eluting stent was successfully placed using a STENT ONYX FRONTIER 3.5X12.   Post intervention, there is a 0% residual stenosis.   There is severe left ventricular systolic dysfunction.   LV end diastolic pressure is mildly elevated.   The left ventricular ejection fraction is less than 25% by visual estimate.  1.  Severe ostial left main stenosis with hazy appearance highly suggestive of acute plaque rupture.  No other obstructive disease. 2.  Severely reduced LV systolic function with an EF of 20% with akinesia of the mid to distal anterior, apical and infero-Apical myocardium. 3.  Successful angioplasty and drug-eluting stent placement to the ostial left main coronary artery.  Biv pacemaker implanted 08/17/22. Admitted 08/05/22 due to chest pain, SOB, sweating, fatigue with subsequent syncope needing bystander CPR. Taken to cath lab: STEMI most likely due to plaque rupture ostial left main which was successfully treated with DES stent. LFT's elevated most likely due to hypoperfusion. Bradycardic so possible pacemaker.    He presents today for a HF f/u visit with a chief complaint of intermittent palpitations. Chronic in nature. Has no other concerns and specifically denies SOB, fatigue, chest pain, cough, swelling, dizziness, weight gain or difficulty sleeping.   Has been started on losartan since last here. Updated echo shows similar EF from previous one. Overall he reports feeling  quite well and is now back to work.   ROS: All systems negative except as listed in HPI, PMH and Problem List.  SH:  Social History   Socioeconomic History   Marital status: Married    Spouse name: Not on file   Number of children: Not on file   Years of education: Not on file   Highest education level: Not on file  Occupational History   Not on file  Tobacco Use   Smoking status: Never   Smokeless tobacco: Never  Vaping Use   Vaping status: Never Used  Substance and Sexual Activity   Alcohol use: Never   Drug use: Never   Sexual activity: Not on file  Other Topics Concern   Not on file  Social History Narrative   Not on file   Social Determinants of Health   Financial Resource Strain: Not on file  Food Insecurity: No Food Insecurity (08/06/2022)   Hunger Vital Sign    Worried About Running Out of Food in the Last Year: Never true    Ran Out of Food in the Last Year: Never true  Transportation Needs: No Transportation Needs (08/06/2022)   PRAPARE - Administrator, Civil Service (Medical): No    Lack of Transportation (Non-Medical): No  Physical Activity: Insufficiently Active (12/11/2019)   Received from [facility], [facility]   Exercise Vital Sign    Days of Exercise per Week: 1 day    Minutes of Exercise per Session: 30 min  Stress: No Stress Concern Present (12/11/2019)  Received from [employer], [facility]   [employer] of Occupational Health - Occupational Stress Questionnaire    Feeling of Stress : Not at all  Social Connections: Unknown (08/06/2022)   Social Connection and Isolation Panel [NHANES]    Frequency of Communication with Friends and Family: More than three times a week    Frequency of Social Gatherings with Friends and Family: More than three times a week    Attends Religious Services: 1 to 4 times per year    Active Member of [employer] or Organizations: No    Attends Banker Meetings: Never    Marital Status:  Patient declined  Catering manager Violence: Not At Risk (08/06/2022)   Humiliation, Afraid, Rape, and Kick questionnaire    Fear of Current or Ex-Partner: No    Emotionally Abused: No    Physically Abused: No    Sexually Abused: No    FH: No family history on file.  Past Medical History:  Diagnosis Date   Complete heart block (HCC)    a. BiV PPM 3/24   Coronary artery disease    a. ant STEMI 3/24 ost LM 95% s/p PCI/DES to LM, pRCA 40%   Essential hypertension    Hyperlipidemia LDL goal <50    Ischemic cardiomyopathy    PAF (paroxysmal atrial fibrillation) (HCC)     Current Outpatient Medications  Medication Sig Dispense Refill   apixaban (ELIQUIS) 5 MG TABS tablet Take 1 tablet (5 mg total) by mouth 2 (two) times daily. 60 tablet 11   atorvastatin (LIPITOR) 80 MG tablet Take 1 tablet (80 mg total) by mouth daily. 90 tablet 3   clopidogrel (PLAVIX) 75 MG tablet Take 1 tablet (75 mg total) by mouth daily. 90 tablet 3   losartan (COZAAR) 25 MG tablet Take 1 tablet (25 mg total) by mouth at bedtime. 90 tablet 3   melatonin 5 MG TABS Take 5-10 mg by mouth at bedtime as needed.     metoprolol succinate (TOPROL XL) 25 MG 24 hr tablet Take 1 tablet (25 mg total) by mouth daily. 30 tablet 5   Multiple Vitamin (MULTIVITAMIN WITH MINERALS) TABS tablet Take 1 tablet by mouth daily.     pantoprazole (PROTONIX) 40 MG tablet Take 1 tablet (40 mg total) by mouth daily. 90 tablet 3   No current facility-administered medications for this visit.   Vitals:   12/21/22 1003  BP: 110/78  Pulse: 77  SpO2: 100%  Weight: 106 lb (48.1 kg)  Height: 5\' 3"  (1.6 m)   Wt Readings from Last 3 Encounters:  12/21/22 106 lb (48.1 kg)  11/20/22 105 lb (47.6 kg)  11/16/22 107 lb 3.2 oz (48.6 kg)   Lab Results  Component Value Date   CREATININE 0.82 09/14/2022   CREATININE 0.87 08/15/2022   CREATININE 0.81 08/10/2022   PHYSICAL EXAM:  General:  Well appearing. No resp difficulty HEENT:  normal Neck: supple. JVP flat. No lymphadenopathy or thryomegaly appreciated. Cor: PMI normal. Regular rate & rhythm. No rubs, gallops II/VI murmur LSB. Lungs: clear Abdomen: soft, nontender, nondistended. No hepatosplenomegaly.  Extremities: no cyanosis, clubbing, rash, edema Neuro: alert & oriented x 3, cranial nerves grossly intact. Moves all 4 extremities w/o difficulty. Affect pleasant.   ECG: not done   ASSESSMENT & PLAN:  1: Ischemic heart failure with reduced ejection fraction- - likely CAD with cath results showing stenosis - NYHA class I - euvolemic - weighing daily; reminded to call for an overnight weight gain  of > 2 pounds or a weekly weight gain of > 5 pounds - weight unchanged from last visit here 6 weeks ago - echo 08/06/22: EF 40-45% - echo 11/23/22: EF 40-45% along with Grade I DD, mild Craig - CRT-P placed 08/17/22 - adds "very little" salt to his food - continue metoprolol succinate 25mg  daily  - continue losartan 25mg  daily at bedtime - begin spironolactone 12.5mg  daily; if he develops dizziness, may need to decrease losartan in half to 12.5mg   - BMP in 1 week and then again at next visit in 1 month - BNP 08/26/22 was 191.1  2: CAD- - STEMI thought to be due to plaque rupture   Prox RCA lesion is 40% stenosed.   Ost LM to Mid LM lesion is 95% stenosed.   A drug-eluting stent was successfully placed using a STENT ONYX FRONTIER 3.5X12.   Post intervention, there is a 0% residual stenosis.   There is severe left ventricular systolic dysfunction.   LV end diastolic pressure is mildly elevated.   The left ventricular ejection fraction is less than 25% by visual estimate.  Severe ostial left main stenosis with hazy appearance highly suggestive of acute plaque rupture.  No other obstructive disease. Severely reduced LV systolic function with an EF of 20% with akinesia of the mid to distal anterior, apical and infero-Apical myocardium. Successful angioplasty and  drug-eluting stent placement to the ostial left main coronary artery. - saw cardiology (Dunn) 06/24 - continue plavix 75mg  daily - continue atorvastatin 80mg  daily  3: Syncope- - saw EP Graciela Husbands) 6/24 - BIV pacemaker CRT-P placed 08/17/22 - BMP 09/14/22 showed sodium 137, potassium 4.1, creatinine 0.82 & GFR >60 - BMP in 1 week and then again in 1 month - Mg 08/10/22 was 4.0  4: Paroxysmal Atrial fibrillation- - previous zio showed atrial fibrillation - continue apixaban 5mg  BID  Return in 1 month,  sooner if needed

## 2022-12-21 NOTE — Patient Instructions (Addendum)
Start spironolactone as 1/2 tablet every morning. If you start to feel dizzy, let us know.    [facility] and get your lab work done

## 2022-12-28 ENCOUNTER — Other Ambulatory Visit
Admission: RE | Admit: 2022-12-28 | Discharge: 2022-12-28 | Disposition: A | Discharge status: Home / Self Care | Payer: 59 | Source: Ambulatory Visit | Attending: Family | Admitting: Family

## 2022-12-28 DIAGNOSIS — I255 Ischemic cardiomyopathy: Secondary | ICD-10-CM | POA: Diagnosis present

## 2022-12-28 LAB — BASIC METABOLIC PANEL
Anion gap: 10 (ref 5–15)
BUN: 20 mg/dL (ref 6–20)
CO2: 25 mmol/L (ref 22–32)
Calcium: 9.1 mg/dL (ref 8.9–10.3)
Chloride: 105 mmol/L (ref 98–111)
Creatinine, Ser: 0.84 mg/dL (ref 0.61–1.24)
GFR, Estimated: >60 mL/min (ref >60)
Glucose, Bld: 100 mg/dL — ABNORMAL HIGH (ref 70–99)
Potassium: 3.4 mmol/L — ABNORMAL LOW (ref 3.5–5.1)
Sodium: 140 mmol/L (ref 135–145)

## 2023-01-24 NOTE — Progress Notes (Signed)
PCP: Azzie Roup, FNP  Primary Cardiologist: Lorine Bears, MD (last seen 06/24)  HPI:  Patient declined interpreter as he used his niece on the phone  Craig Reid is a 62 y/o male with a history of CAD/ STEMI thought to be due to plaque rupture and heart failure, biventricular pacemaker placed on 08/17/22, ICM and HTN.   Echo 08/06/22: EF 40-45% Echo 11/23/22: EF 40-45% along with Grade I DD, mild Craig  LHC 08/05/22:    Prox RCA lesion is 40% stenosed.   Ost LM to Mid LM lesion is 95% stenosed.   A drug-eluting stent was successfully placed using a STENT ONYX FRONTIER 3.5X12.   Post intervention, there is a 0% residual stenosis.   There is severe left ventricular systolic dysfunction.   LV end diastolic pressure is mildly elevated.   The left ventricular ejection fraction is less than 25% by visual estimate.  1.  Severe ostial left main stenosis with hazy appearance highly suggestive of acute plaque rupture.  No other obstructive disease. 2.  Severely reduced LV systolic function with an EF of 20% with akinesia of the mid to distal anterior, apical and infero-Apical myocardium. 3.  Successful angioplasty and drug-eluting stent placement to the ostial left main coronary artery.  Biv pacemaker implanted 08/17/22. Admitted 08/05/22 due to chest pain, SOB, sweating, fatigue with subsequent syncope needing bystander CPR. Taken to cath lab: STEMI most likely due to plaque rupture ostial left main which was successfully treated with DES stent. LFT's elevated most likely due to hypoperfusion. Bradycardic so possible pacemaker.    He presents today for a HF f/u visit with a chief complaint of palpitations. Intermittent in nature but they still wake him up from sleep at times. Palpitations also occur during the day and last about 1-2 minutes. He has associated feeling of his toes being cold/ tingling especially at night along with dizziness if bending over at the waist and coming up too quickly. Has  no other symptoms and specifically denies fatigue, shortness of breath, cough, chest pain, abdominal distention, pedal edema or weight gain.    At last visit, he was started on spironolactone 12.5mg  daily and doesn't notice any issues with taking this medication.    ROS: All systems negative except as listed in HPI, PMH and Problem List.  SH:  Social History   Socioeconomic History   Marital status: Married    Spouse name: Not on file   Number of children: Not on file   Years of education: Not on file   Highest education level: Not on file  Occupational History   Not on file  Tobacco Use   Smoking status: Never   Smokeless tobacco: Never  Vaping Use   Vaping status: Never Used  Substance and Sexual Activity   Alcohol use: Never   Drug use: Never   Sexual activity: Not on file  Other Topics Concern   Not on file  Social History Narrative   Not on file   Social Determinants of Health   Financial Resource Strain: Not on file  Food Insecurity: No Food Insecurity (08/06/2022)   Hunger Vital Sign    Worried About Running Out of Food in the Last Year: Never true    Ran Out of Food in the Last Year: Never true  Transportation Needs: No Transportation Needs (08/06/2022)   PRAPARE - Administrator, Civil Service (Medical): No    Lack of Transportation (Non-Medical): No  Physical Activity: Insufficiently Active (12/11/2019)  Received from [facility], [facility]   Exercise Vital Sign    Days of Exercise per Week: 1 day    Minutes of Exercise per Session: 30 min  Stress: No Stress Concern Present (12/11/2019)   Received from [facility], [facility] of Occupational Health - Occupational Stress Questionnaire    Feeling of Stress : Not at all  Social Connections: Unknown (08/06/2022)   Social Connection and Isolation Panel [NHANES]    Frequency of Communication with Friends and Family: More than three times a week    Frequency of Social  Gatherings with Friends and Family: More than three times a week    Attends Religious Services: 1 to 4 times per year    Active Member of [employer] or Organizations: No    Attends Banker Meetings: Never    Marital Status: Patient declined  Catering manager Violence: Not At Risk (08/06/2022)   Humiliation, Afraid, Rape, and Kick questionnaire    Fear of Current or Ex-Partner: No    Emotionally Abused: No    Physically Abused: No    Sexually Abused: No    FH: No family history on file.  Past Medical History:  Diagnosis Date   Complete heart block (HCC)    a. BiV PPM 3/24   Coronary artery disease    a. ant STEMI 3/24 ost LM 95% s/p PCI/DES to LM, pRCA 40%   Essential hypertension    Hyperlipidemia LDL goal <50    Ischemic cardiomyopathy    PAF (paroxysmal atrial fibrillation) (HCC)     Current Outpatient Medications  Medication Sig Dispense Refill   apixaban (ELIQUIS) 5 MG TABS tablet Take 1 tablet (5 mg total) by mouth 2 (two) times daily. 60 tablet 11   atorvastatin (LIPITOR) 80 MG tablet Take 1 tablet (80 mg total) by mouth daily. 90 tablet 3   clopidogrel (PLAVIX) 75 MG tablet Take 1 tablet (75 mg total) by mouth daily. 90 tablet 3   losartan (COZAAR) 25 MG tablet Take 1 tablet (25 mg total) by mouth at bedtime. 90 tablet 3   melatonin 5 MG TABS Take 5-10 mg by mouth at bedtime as needed.     metoprolol succinate (TOPROL XL) 25 MG 24 hr tablet Take 1 tablet (25 mg total) by mouth daily. 30 tablet 5   Multiple Vitamin (MULTIVITAMIN WITH MINERALS) TABS tablet Take 1 tablet by mouth daily.     pantoprazole (PROTONIX) 40 MG tablet Take 1 tablet (40 mg total) by mouth daily. 90 tablet 3   spironolactone (ALDACTONE) 25 MG tablet Take 0.5 tablets (12.5 mg total) by mouth daily. 15 tablet 3   No current facility-administered medications for this visit.   Vitals:   01/25/23 0909  BP: (!) 141/78  Pulse: 70  SpO2: 100%  Weight: 107 lb 3.2 oz (48.6 kg)   Wt Readings from  Last 3 Encounters:  01/25/23 107 lb 3.2 oz (48.6 kg)  12/21/22 106 lb (48.1 kg)  11/20/22 105 lb (47.6 kg)   Lab Results  Component Value Date   CREATININE 0.84 12/28/2022   CREATININE 0.82 09/14/2022   CREATININE 0.87 08/15/2022    PHYSICAL EXAM:  General:  Well appearing. No resp difficulty HEENT: normal Neck: supple. JVP flat. No lymphadenopathy or thryomegaly appreciated. Cor: PMI normal. Regular rate & rhythm. No rubs, gallops II/VI murmur LSB. Lungs: clear Abdomen: soft, nontender, nondistended. No hepatosplenomegaly.  Extremities: no cyanosis, clubbing, rash, edema Neuro: alert & oriented x  3, cranial nerves grossly intact. Moves all 4 extremities w/o difficulty. Affect pleasant.   ECG: v-paced, HR 63   ASSESSMENT & PLAN:  1: Ischemic heart failure with reduced ejection fraction- - likely CAD with cath results showing stenosis - NYHA class I - euvolemic - weighing daily; reminded to call for an overnight weight gain of > 2 pounds or a weekly weight gain of > 5 pounds - weight stable from last visit here 1 month ago - echo 08/06/22: EF 40-45% - echo 11/23/22: EF 40-45% along with Grade I DD, mild Craig - CRT-P placed 08/17/22 - adds "very little" salt to his food - continue metoprolol succinate 25mg  daily  - continue losartan 25mg  daily at bedtime - continue spironolactone 12.5mg  daily - begin farxiga 10mg  daily; voucher provided and PharmD will look into cost and if not covered, will try jardiance - BMP today & then, again, at next visit - BNP 08/26/22 was 191.1  2: CAD- - STEMI thought to be due to plaque rupture   Prox RCA lesion is 40% stenosed.   Ost LM to Mid LM lesion is 95% stenosed.   A drug-eluting stent was successfully placed using a STENT ONYX FRONTIER 3.5X12.   Post intervention, there is a 0% residual stenosis.   There is severe left ventricular systolic dysfunction.   LV end diastolic pressure is mildly elevated.   The left ventricular ejection  fraction is less than 25% by visual estimate.  Severe ostial left main stenosis with hazy appearance highly suggestive of acute plaque rupture.  No other obstructive disease. Severely reduced LV systolic function with an EF of 20% with akinesia of the mid to distal anterior, apical and infero-Apical myocardium. Successful angioplasty and drug-eluting stent placement to the ostial left main coronary artery. - saw cardiology (Dunn) 06/24 - continue plavix 75mg  daily - continue atorvastatin 80mg  daily - LDL 11/16/22 was 39  3: Syncope- - saw EP Graciela Husbands) 6/24; returns ~ 9 months - BIV pacemaker CRT-P placed 08/17/22 - EKG today is vpaced - BMP 12/28/22 showed sodium 140, potassium 3.4, creatinine 0.84 & GFR >60 - BMP/ Mg today - Mg 08/10/22 was 4.0  4: Paroxysmal Atrial fibrillation- - previous zio showed atrial fibrillation - continue apixaban 5mg  BID - continues to experience palpitations during the day as well as waking him up from sleep - will send a message to Dr Graciela Husbands  Return in 3 weeks, sooner if needed

## 2023-01-25 ENCOUNTER — Telehealth (HOSPITAL_COMMUNITY): Payer: Self-pay

## 2023-01-25 ENCOUNTER — Ambulatory Visit: Payer: 59 | Attending: Family | Admitting: Family

## 2023-01-25 ENCOUNTER — Encounter: Payer: Self-pay | Admitting: Family

## 2023-01-25 ENCOUNTER — Other Ambulatory Visit: Payer: Self-pay | Admitting: Family

## 2023-01-25 ENCOUNTER — Other Ambulatory Visit (HOSPITAL_COMMUNITY): Payer: Self-pay

## 2023-01-25 VITALS — BP 141/78 | HR 70 | Wt 107.2 lb

## 2023-01-25 DIAGNOSIS — I480 Paroxysmal atrial fibrillation: Secondary | ICD-10-CM | POA: Diagnosis not present

## 2023-01-25 DIAGNOSIS — Z7902 Long term (current) use of antithrombotics/antiplatelets: Secondary | ICD-10-CM | POA: Diagnosis not present

## 2023-01-25 DIAGNOSIS — I2510 Atherosclerotic heart disease of native coronary artery without angina pectoris: Secondary | ICD-10-CM | POA: Insufficient documentation

## 2023-01-25 DIAGNOSIS — I110 Hypertensive heart disease with heart failure: Secondary | ICD-10-CM | POA: Diagnosis not present

## 2023-01-25 DIAGNOSIS — Z950 Presence of cardiac pacemaker: Secondary | ICD-10-CM | POA: Insufficient documentation

## 2023-01-25 DIAGNOSIS — I255 Ischemic cardiomyopathy: Secondary | ICD-10-CM

## 2023-01-25 DIAGNOSIS — Z79899 Other long term (current) drug therapy: Secondary | ICD-10-CM | POA: Insufficient documentation

## 2023-01-25 DIAGNOSIS — I252 Old myocardial infarction: Secondary | ICD-10-CM | POA: Diagnosis not present

## 2023-01-25 DIAGNOSIS — R55 Syncope and collapse: Secondary | ICD-10-CM | POA: Diagnosis not present

## 2023-01-25 DIAGNOSIS — Z7901 Long term (current) use of anticoagulants: Secondary | ICD-10-CM | POA: Insufficient documentation

## 2023-01-25 DIAGNOSIS — R002 Palpitations: Secondary | ICD-10-CM | POA: Diagnosis not present

## 2023-01-25 MED ORDER — DAPAGLIFLOZIN PROPANEDIOL 10 MG PO TABS: 10.0000 mg | ORAL_TABLET | Freq: Every day | ORAL | 5 refills | Status: DC

## 2023-01-25 NOTE — Telephone Encounter (Signed)
Patient Advocate Encounter  Prior authorization for [place] [employer] has been submitted and approved. Test billing returns $0 for 30 day supply.  Key: [id number] Effective: 01/25/2023 to 01/24/2024  Burnell Blanks, CPhT Rx Patient Advocate Phone: [phone]

## 2023-01-25 NOTE — Patient Instructions (Signed)
Begin farxiga as 1 tablet every morning.

## 2023-01-26 LAB — BASIC METABOLIC PANEL
BUN/Creatinine Ratio: 24 (ref 10–24)
BUN: 21 mg/dL (ref 8–27)
CO2: 24 mmol/L (ref 20–29)
Calcium: 9.4 mg/dL (ref 8.6–10.2)
Chloride: 104 mmol/L (ref 96–106)
Creatinine, Ser: 0.86 mg/dL (ref 0.76–1.27)
Glucose: 76 mg/dL (ref 70–99)
Potassium: 4 mmol/L (ref 3.5–5.2)
Sodium: 141 mmol/L (ref 134–144)
eGFR: 99 mL/min/{1.73_m2} (ref >59)

## 2023-02-14 NOTE — Progress Notes (Signed)
PCP: Azzie Roup, FNP  Primary Cardiologist: Lorine Bears, MD (last seen 06/24)  HPI:  Patient declined interpreter as he wanted his niece (in person) to interpret.  Mr Valle is a 62 y/o male with a history of CAD/ STEMI thought to be due to plaque rupture and heart failure, biventricular pacemaker placed on 08/17/22 due to CHB, PAF, hyperlipidemia, ICM and HTN.   Admitted 08/05/22 due to chest pain, SOB, sweating, fatigue with subsequent syncope needing bystander CPR for 1 minute. Upon EMS arrival EKG showed anterior ST elevation MI. Taken to cath lab ergently: STEMI most likely due to plaque rupture ostial left main which was successfully treated with DES stent. LFT's elevated most likely due to hypoperfusion. Bradycardic so possible pacemaker.    Biv pacemaker implanted 08/17/22.  Echo 08/06/22: EF 40-45% Echo 11/23/22: EF 40-45% along with Grade I DD, mild MR  LHC 08/05/22:    Prox RCA lesion is 40% stenosed.   Ost LM to Mid LM lesion is 95% stenosed.   A drug-eluting stent was successfully placed using a STENT ONYX FRONTIER 3.5X12.   Post intervention, there is a 0% residual stenosis.   There is severe left ventricular systolic dysfunction.   LV end diastolic pressure is mildly elevated.   The left ventricular ejection fraction is less than 25% by visual estimate.  1.  Severe ostial left main stenosis with hazy appearance highly suggestive of acute plaque rupture.  No other obstructive disease. 2.  Severely reduced LV systolic function with an EF of 20% with akinesia of the mid to distal anterior, apical and infero-Apical myocardium. 3.  Successful angioplasty and drug-eluting stent placement to the ostial left main coronary artery.  He presents today for a HF f/u visit with a chief complaint of intermittent palpitations. He does feel like they have lessened since being placed on farxiga and no longer wake him up from sleep. 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 Has noticed since starting farxiga that he experiences intermittent numbness/ tingling in his lower legs/ feet. Has associated dizziness when bending over too quickly. Denies shortness of breath, fatigue, chest pain, cough, abdominal distention, pedal edema, weight gain or difficulty sleeping.   At last visit, he was started on farxiga 10mg  daily. Subsequently pharmacy advocate ran a test claim and jardiance will be $0 for patient.   ROS: All systems negative except as listed in HPI, PMH and Problem List.  SH:  Social History   Socioeconomic History   Marital status: Married    Spouse name: Not on file   Number of children: Not on file   Years of education: Not on file   Highest education level: Not on file  Occupational History   Not on file  Tobacco Use   Smoking status: Never   Smokeless tobacco: Never  Vaping Use   Vaping status: Never Used  Substance and Sexual Activity   Alcohol use: Never   Drug use: Never   Sexual activity: Not on file  Other Topics Concern   Not on file  Social History Narrative   Not on file   Social Determinants of Health   Financial Resource Strain: Not on file  Food Insecurity: No Food Insecurity (08/06/2022)   Hunger Vital Sign    Worried About Running Out of Food in the Last Year: Never true    Ran Out of Food in the Last Year: Never true  Transportation Needs: No Transportation Needs (08/06/2022)  PRAPARE - Administrator, Civil Service (Medical): No    Lack of Transportation (Non-Medical): No  Physical Activity: Insufficiently Active (12/11/2019)   [facility] from [facility], [facility]   Exercise Vital Sign    Days of Exercise per Week: 1 day    Minutes of Exercise per Session: 30 min  Stress: No Stress Concern Present (12/11/2019)   Received from [facility], [facility] - Occupational Stress Questionnaire    Feeling of Stress : Not at all  Social Connections:  Unknown (08/06/2022)   Social Connection and Isolation Panel [NHANES]    Frequency of Communication with Friends and Family: More than three times a week    Frequency of Social Gatherings with Friends and Family: More than three times a week    Attends Religious Services: 1 to 4 times per year    Active Member of [employer] or Organizations: No    Attends Banker Meetings: Never    Marital Status: Patient declined  Catering manager Violence: Not At Risk (08/06/2022)   Humiliation, Afraid, Rape, and Kick questionnaire    Fear of Current or Ex-Partner: No    Emotionally Abused: No    Physically Abused: No    Sexually Abused: No    FH: No family history on file.  Past Medical History:  Diagnosis Date   Complete heart block (HCC)    a. BiV PPM 3/24   Coronary artery disease    a. ant STEMI 3/24 ost LM 95% s/p PCI/DES to LM, pRCA 40%   Essential hypertension    Hyperlipidemia LDL goal <50    Ischemic cardiomyopathy    PAF (paroxysmal atrial fibrillation) (HCC)     Current Outpatient Medications  Medication Sig Dispense Refill   apixaban (ELIQUIS) 5 MG TABS tablet Take 1 tablet (5 mg total) by mouth 2 (two) times daily. 60 tablet 11   atorvastatin (LIPITOR) 80 MG tablet Take 1 tablet (80 mg total) by mouth daily. 90 tablet 3   clopidogrel (PLAVIX) 75 MG tablet Take 1 tablet (75 mg total) by mouth daily. 90 tablet 3   dapagliflozin propanediol (FARXIGA) 10 MG TABS tablet Take 1 tablet (10 mg total) by mouth daily before breakfast. 30 tablet 5   losartan (COZAAR) 25 MG tablet Take 1 tablet (25 mg total) by mouth at bedtime. 90 tablet 3   melatonin 5 MG TABS Take 5-10 mg by mouth at bedtime as needed.     metoprolol succinate (TOPROL XL) 25 MG 24 hr tablet Take 1 tablet (25 mg total) by mouth daily. 30 tablet 5   Multiple Vitamin (MULTIVITAMIN WITH MINERALS) TABS tablet Take 1 tablet by mouth daily.     pantoprazole (PROTONIX) 40 MG tablet Take 1 tablet (40 mg total) by mouth  daily. 90 tablet 3   spironolactone (ALDACTONE) 25 MG tablet Take 0.5 tablets (12.5 mg total) by mouth daily. 15 tablet 3   No current facility-administered medications for this visit.   Vitals:   02/15/23 0923  BP: 111/76  Pulse: 66  SpO2: 100%  Weight: 105 lb (47.6 kg)   Wt Readings from Last 3 Encounters:  02/15/23 105 lb (47.6 kg)  01/25/23 107 lb 3.2 oz (48.6 kg)  12/21/22 106 lb (48.1 kg)   Lab Results  Component Value Date   CREATININE 0.86 01/25/2023   CREATININE 0.84 12/28/2022   CREATININE 0.82 09/14/2022   PHYSICAL EXAM:  General:  Well appearing. No  resp difficulty HEENT: normal Neck: supple. JVP flat. No lymphadenopathy or thryomegaly appreciated. Cor: PMI normal. Regular rate & rhythm. No rubs, gallops II/VI murmur LSB. Lungs: clear Abdomen: soft, nontender, nondistended. No hepatosplenomegaly.  Extremities: no cyanosis, clubbing, rash, edema Neuro: alert & oriented x 3, cranial nerves grossly intact. Moves all 4 extremities w/o difficulty. Affect pleasant.   ECG: not done   ASSESSMENT & PLAN:  1: Ischemic heart failure with reduced ejection fraction- - likely CAD with cath results showing stenosis - NYHA class I - euvolemic - weighing daily; reminded to call for an overnight weight gain of > 2 pounds or a weekly weight gain of > 5 pounds - weight down 2 pounds from last visit here 3 weeks ago - echo 08/06/22: EF 40-45% - echo 11/23/22: EF 40-45% along with Grade I DD, mild MR - CRT-P placed 08/17/22 - adds "very little" salt to his food - continue metoprolol succinate 25mg  daily  - continue losartan 25mg  daily at bedtime - continue spironolactone 12.5mg  daily - stop farxiga and begin jardiance since it will be $0; hopefully the leg numbness/ tingling will improve with the change - BMP / Mg today - can use tonic water to see if this helps with leg cramps - BNP 08/26/22 was 191.1  2: CAD- - saw cardiology ([place]) 06/24; returns next week - continue  plavix 75mg  daily - continue atorvastatin 80mg  daily but move it to evening - LDL 11/16/22 was 39 - STEMI thought to be due to plaque rupture - LHC 08/05/22:   Prox RCA lesion is 40% stenosed.   Ost LM to Mid LM lesion is 95% stenosed.   A drug-eluting stent was successfully placed using a STENT ONYX FRONTIER 3.5X12.   Post intervention, there is a 0% residual stenosis.   There is severe left ventricular systolic dysfunction.   LV end diastolic pressure is mildly elevated.   The left ventricular ejection fraction is less than 25% by visual estimate.  Severe ostial left main stenosis with hazy appearance highly suggestive of acute plaque rupture.  No other obstructive disease. Severely reduced LV systolic function with an EF of 20% with akinesia of the mid to distal anterior, apical and infero-Apical myocardium. Successful angioplasty and drug-eluting stent placement to the ostial left main coronary artery.  3: Syncope- - saw EP Graciela Husbands) 6/24; returns ~ 9 months - BIV pacemaker CRT-P placed 08/17/22 - BMP 01/25/23 showed sodium 141, potassium 4.0, creatinine 0.86 & GFR 99 - Mg 08/10/22 was 4.0 - recheck BMP/ Mg today  4: Paroxysmal Atrial fibrillation- - previous zio showed atrial fibrillation - continue apixaban 5mg  BID - continues to experience palpitations during the day but they are no longer waking him up from sleep since starting SGLT2  Return in 6 weeks, sooner if needed.

## 2023-02-15 ENCOUNTER — Telehealth: Payer: Self-pay

## 2023-02-15 ENCOUNTER — Encounter: Payer: Self-pay | Admitting: Family

## 2023-02-15 ENCOUNTER — Ambulatory Visit (HOSPITAL_BASED_OUTPATIENT_CLINIC_OR_DEPARTMENT_OTHER): Payer: 59 | Admitting: Family

## 2023-02-15 ENCOUNTER — Other Ambulatory Visit
Admission: RE | Admit: 2023-02-15 | Discharge: 2023-02-15 | Disposition: A | Discharge status: Home / Self Care | Payer: 59 | Source: Ambulatory Visit | Attending: Family | Admitting: Family

## 2023-02-15 ENCOUNTER — Other Ambulatory Visit: Payer: Self-pay | Admitting: Family

## 2023-02-15 VITALS — BP 111/76 | HR 66 | Wt 105.0 lb

## 2023-02-15 DIAGNOSIS — I480 Paroxysmal atrial fibrillation: Secondary | ICD-10-CM | POA: Diagnosis not present

## 2023-02-15 DIAGNOSIS — I5020 Unspecified systolic (congestive) heart failure: Secondary | ICD-10-CM | POA: Insufficient documentation

## 2023-02-15 DIAGNOSIS — I2510 Atherosclerotic heart disease of native coronary artery without angina pectoris: Secondary | ICD-10-CM | POA: Diagnosis not present

## 2023-02-15 DIAGNOSIS — R55 Syncope and collapse: Secondary | ICD-10-CM | POA: Diagnosis not present

## 2023-02-15 LAB — BASIC METABOLIC PANEL
Anion gap: 9 (ref 5–15)
BUN: 23 mg/dL — ABNORMAL HIGH (ref 6–20)
CO2: 25 mmol/L (ref 22–32)
Calcium: 9 mg/dL (ref 8.9–10.3)
Chloride: 103 mmol/L (ref 98–111)
Creatinine, Ser: 0.74 mg/dL (ref 0.61–1.24)
GFR, Estimated: >60 mL/min (ref >60)
Glucose, Bld: 84 mg/dL (ref 70–99)
Potassium: 3.4 mmol/L — ABNORMAL LOW (ref 3.5–5.1)
Sodium: 137 mmol/L (ref 135–145)

## 2023-02-15 LAB — MAGNESIUM: Magnesium: 2 mg/dL (ref 1.7–2.4)

## 2023-02-15 MED ORDER — EMPAGLIFLOZIN 10 MG PO TABS: 10.0000 mg | ORAL_TABLET | Freq: Every day | ORAL | 5 refills | Status: DC

## 2023-02-15 MED ORDER — POTASSIUM CHLORIDE CRYS ER 20 MEQ PO TBCR: 20.0000 meq | EXTENDED_RELEASE_TABLET | Freq: Every day | ORAL | 3 refills | Status: DC

## 2023-02-15 NOTE — Telephone Encounter (Addendum)
Per pt request. Okay to give information to niece Joyce Gross. RN called Joyce Gross regarding pt lab results and recommendation per Clarisa Kindred, FNP Lab results. Potassium 20 mEq (1 tablet) daily. Sent to requested pharmacy. Labs recheck next wk with Eula Listen Pt's niece aware, agreeable, and verbalized understanding    ----- Message from Delma Freeze sent at 02/15/2023 11:43 AM EDT ----- Please call his niece Joyce Gross:  Magnesium level is normal but potassium level is a little low. Please start potassium daily and your labs can be rechecked next week with Eula Listen.

## 2023-02-15 NOTE — Patient Instructions (Addendum)
DISCONTINUE FARXIGA  START JARDIANCE 10 MG ONCE DAILY  Go over to the [facility]. Go pass the gift shop and have your blood work completed.

## 2023-02-17 NOTE — Progress Notes (Signed)
Cardiology Office Note    Date:  02/22/2023   ID:  [name], DOB November 16, 1960, MRN [id number]  PCP:  [name], FNP  Cardiologist:  Lorine Bears, MD  Electrophysiologist:  Lanier Prude, MD   Chief Complaint: Follow-up  History of Present Illness:   Craig Reid is a 62 y.o. male with history of CAD with an anterior STEMI s/p PCI/DES to the left main in 08/2022, HFrEF secondary ICM, syncope with complete heart block s/p BiV PPM in 08/2022, PAF on Eliquis, HTN, and HLD who presents for follow-up of his CAD, cardiomyopathy, CHB, and PAF.   He was admitted to [facility] in 08/2022 following a syncopal episode at work with a hospital CPR 1 minute gaining consciousness.  Upon EMS arrival EKG showed anterior ST elevation MI.  In the ED, evidence of bradycardia with intermittent high-grade AV block.  Emergent LHC showed severe ostial left main stenosis with hazy appearance highly suggestive of acute plaque rupture.  Otherwise, there was no other obstructive disease.  There was severely reduced LV systolic function with an EF of 20% with akinesia of the mid to distal anterior, apical, and inferoapical myocardium.  He underwent successful PCI/DES to the ostial left main.  During the admission, he was evaluated by EP given bradycardia and high-grade AV block with 2-1 conduction with recommendation for for continued monitoring following PCI.  Subsequent outpatient cardiac monitor showed an average rate of 63 bpm with a range of 28 to 154 bpm, 1 run of NSVT lasting 7 beats, 7% A-fib/flutter burden with the longest episode lasting 9 hours and 15 minutes, 6226 episodes of high degree/third-degree AV block.  Patient subsequently underwent CRT-P on 08/17/2022.  After discussion with interventional cardiology and EP, the patient was subsequently placed on apixaban with transition from Effient to clopidogrel and discontinuation of aspirin given documented A-fib on outpatient cardiac monitor.  I  last saw him in 11/2022, at which time he was without symptoms of angina or cardiac decompensation.  He was no longer on losartan, having ran out of this prior to his CHF clinic appointment.  Patient and family preferred to defer reinitiation of ARB, or escalation of GDMT.  Limited echo on 11/23/2022 showed an EF of 40 to 45%, hypokinesis of the basal to mid septal, anteroseptal, and basal to mid inferior regions, grade 1 diastolic dysfunction, normal RV systolic function and ventricular cavity size, mild mitral regurgitation, and an estimated right atrial pressure of 3 mmHg.  Losartan was subsequently reinitiated by EP.  GDMT was further escalated with addition of spironolactone in 12/2022.  He comes in doing well today and is without symptoms of angina or cardiac decompensation.  No dizziness, presyncope, or syncope.  He does note some palpitations when laying down at night, improved from prior visit.  No lower extremity swelling, abdominal distention, or progressive orthopnea.  He is watching his salt and fluid intake.  His weight is down 6 pounds today by our scale when compared to his visit in 11/2022.  He remains adherent to apixaban and clopidogrel.  No falls, hematochezia, or melena.  Overall, he feels like he is doing well and does not have any acute cardiac concerns at this time.   Labs independently reviewed: 02/2023 - potassium 3.4, BUN 23, serum creatinine 0.74, magnesium 2.0 11/2022 - TC 107, TG 87, HDL 51, LDL 39, direct LDL 41, AST/ALT normal 10/2022 - Hgb 13.1, PLT 183 08/2022 - TSH normal, LP(a) 99.8, A1c 5.0  Past Medical History:  Diagnosis Date   Complete heart block (HCC)    a. BiV PPM 3/24   Coronary artery disease    a. ant STEMI 3/24 ost LM 95% s/p PCI/DES to LM, pRCA 40%   Essential hypertension    Hyperlipidemia LDL goal <50    Ischemic cardiomyopathy    PAF (paroxysmal atrial fibrillation) (HCC)     Past Surgical History:  Procedure Laterality Date   BIV PACEMAKER  INSERTION CRT-P N/A 08/17/2022   Procedure: BIV PACEMAKER INSERTION CRT-P;  Surgeon: Duke Salvia, MD;  Location: [facility] INVASIVE CV LAB;  Service: Cardiovascular;  Laterality: N/A;   CORONARY/GRAFT ACUTE MI REVASCULARIZATION N/A 08/05/2022   Procedure: Coronary/Graft Acute MI Revascularization;  Surgeon: Iran Ouch, MD;  Location: [facility] INVASIVE CV LAB;  Service: Cardiovascular;  Laterality: N/A;   LAPAROTOMY N/A 04/21/2019   Procedure: EXPLORATORY LAPAROTOMY, gastric ulcer repair;  Surgeon: Sung Amabile, DO;  Location: [facility] ORS;  Service: General;  Laterality: N/A;   LEFT HEART CATH AND CORONARY ANGIOGRAPHY N/A 08/05/2022   Procedure: LEFT HEART CATH AND CORONARY ANGIOGRAPHY;  Surgeon: Iran Ouch, MD;  Location: [facility] INVASIVE CV LAB;  Service: Cardiovascular;  Laterality: N/A;    Current Medications: Current Meds  Medication Sig   apixaban (ELIQUIS) 5 MG TABS tablet Take 1 tablet (5 mg total) by mouth 2 (two) times daily.   atorvastatin (LIPITOR) 80 MG tablet Take 1 tablet (80 mg total) by mouth daily.   clopidogrel (PLAVIX) 75 MG tablet Take 1 tablet (75 mg total) by mouth daily.   empagliflozin (JARDIANCE) 10 MG TABS tablet Take 1 tablet (10 mg total) by mouth daily before breakfast.   losartan (COZAAR) 25 MG tablet Take 1 tablet (25 mg total) by mouth at bedtime.   melatonin 5 MG TABS Take 5-10 mg by mouth at bedtime as needed.   metoprolol succinate (TOPROL XL) 25 MG 24 hr tablet Take 1 tablet (25 mg total) by mouth daily.   Multiple Vitamin (MULTIVITAMIN WITH MINERALS) TABS tablet Take 1 tablet by mouth daily.   pantoprazole (PROTONIX) 40 MG tablet Take 1 tablet (40 mg total) by mouth daily.   potassium chloride SA (KLOR-CON M) 20 MEQ tablet Take 1 tablet (20 mEq total) by mouth daily.   spironolactone (ALDACTONE) 25 MG tablet Take 0.5 tablets (12.5 mg total) by mouth daily.    Allergies:   Patient has no known allergies.   Social History   Socioeconomic History    Marital status: Married    Spouse name: Not on file   Number of children: Not on file   Years of education: Not on file   Highest education level: Not on file  Occupational History   Not on file  Tobacco Use   Smoking status: Never   Smokeless tobacco: Never  Vaping Use   Vaping status: Never Used  Substance and Sexual Activity   Alcohol use: Never   Drug use: Never   Sexual activity: Not on file  Other Topics Concern   Not on file  Social History Narrative   Not on file   Social Determinants of Health   Financial Resource Strain: Not on file  Food Insecurity: No Food Insecurity (08/06/2022)   Hunger Vital Sign    Worried About Running Out of Food in the Last Year: Never true    Ran Out of Food in the Last Year: Never true  Transportation Needs: No Transportation Needs (08/06/2022)   PRAPARE - Transportation    Lack of  Transportation (Medical): No    Lack of Transportation (Non-Medical): No  Physical Activity: Insufficiently Active (12/11/2019)   Received from [facility], [facility]   Exercise Vital Sign    Days of Exercise per Week: 1 day    Minutes of Exercise per Session: 30 min  Stress: No Stress Concern Present (12/11/2019)   Received from [facility], [facility] of Occupational [facility] - Occupational Stress Questionnaire    Feeling of Stress : Not at all  Social Connections: Unknown (08/06/2022)   Social Connection and Isolation Panel [NHANES]    Frequency of Communication with Friends and Family: More than three times a week    Frequency of Social Gatherings with Friends and Family: More than three times a week    Attends Religious Services: 1 to 4 times per year    Active Member of [employer] or Organizations: No    Attends Banker Meetings: Never    Marital Status: Patient declined     Family History:  The patient's family history is not on file.  ROS:   12-point review of systems is negative unless otherwise noted in the  HPI.   EKGs/Labs/Other Studies Reviewed:    Studies reviewed were summarized above. The additional studies were reviewed today:  Limited echo 11/23/2022: 1. Left ventricular ejection fraction, by estimation, is 40 to 45%. Left  ventricular ejection fraction by 2D MOD biplane is 44.3 %. The left  ventricle has mildly decreased function. The left ventricle demonstrates  regional wall motion abnormalities  (Hypokinesis of the basal to mid septal, anteroseptal, and basal to mid  inferior regions ). Left ventricular diastolic parameters are consistent  with Grade I diastolic dysfunction (impaired relaxation). The average left  ventricular global longitudinal  strain is -14.0 %.   2. Right ventricular systolic function is normal. The right ventricular  size is normal. Tricuspid regurgitation signal is inadequate for assessing  PA pressure.   3. The mitral valve is normal in structure. Mild mitral valve  regurgitation. No evidence of mitral stenosis.   4. The aortic valve is normal in structure. Aortic valve regurgitation is  not visualized. No aortic stenosis is present.   5. The inferior vena cava is normal in size with greater than 50%  respiratory variability, suggesting right atrial pressure of 3 mmHg.  __________  Luci Bank patch 08/2022: HR 28 - 154 bpm, average 63 bpm. 1 nonsustained VT lasting 7 beats. 7% AF/AFL burden, longest episode lasting 9hr41min. 6226 episodes of high degree/3rd degree AV block. Rare supraventricular ectopy. Rare ventricular ectopy.   Patient is s/p PPM with Dr Graciela Husbands. __________   2D echo 08/06/2022: 1. Left ventricular ejection fraction, by estimation, is 40 to 45%. The  left ventricle has mildly decreased function. The left ventricle  demonstrates global hypokinesis. Left ventricular diastolic parameters are  indeterminate.   2. Right ventricular systolic function is normal. The right ventricular  size is normal. There is normal pulmonary artery systolic  pressure.   3. The mitral valve is normal in structure. Mild mitral valve  regurgitation. No evidence of mitral stenosis.   4. The aortic valve is normal in structure. Aortic valve regurgitation is  trivial. Aortic valve sclerosis is present, with no evidence of aortic  valve stenosis.  __________   LHC 08/05/2022:   Prox RCA lesion is 40% stenosed.   Ost LM to Mid LM lesion is 95% stenosed.   A drug-eluting stent was successfully placed using a STENT ONYX  FRONTIER 3.5X12.   Post intervention, there is a 0% residual stenosis.   There is severe left ventricular systolic dysfunction.   LV end diastolic pressure is mildly elevated.   The left ventricular ejection fraction is less than 25% by visual estimate.   1.  Severe ostial left main stenosis with hazy appearance highly suggestive of acute plaque rupture.  No other obstructive disease. 2.  Severely reduced LV systolic function with an EF of 20% with akinesia of the mid to distal anterior, apical and infero-Apical myocardium. 3.  Successful angioplasty and drug-eluting stent placement to the ostial left main coronary artery.   Recommendations: Dual antiplatelet therapy for at least 12 months. No beta-blockers for now due to bradycardia.  Monitor heart rate and blood pressure closely as the patient is at risk for decompensation given degree of myocardium at risk. Delays and door to device time related to language barrier and the difficulty of obtaining an interpreter.   EKG:  EKG is ordered today.  The EKG ordered today demonstrates A-sensed, V-paced rhythm, 70 bpm  Recent Labs: 08/06/2022: B Natriuretic Peptide 191.1 08/07/2022: TSH 2.107 10/19/2022: Hemoglobin 13.1; Platelets 183 11/16/2022: ALT 39 02/15/2023: BUN 23; Creatinine, Ser 0.74; Magnesium 2.0; Potassium 3.4; Sodium 137  Recent Lipid Panel    Component Value Date/Time   CHOL 107 11/16/2022 1220   TRIG 87 11/16/2022 1220   HDL 51 11/16/2022 1220   CHOLHDL 2.1 11/16/2022 1220    VLDL 17 11/16/2022 1220   LDLCALC 39 11/16/2022 1220   LDLDIRECT 41 11/16/2022 1220    PHYSICAL EXAM:    VS:  BP 106/70 (BP Location: Left Arm, Patient Position: Sitting, Cuff Size: Normal)   Pulse 70   Ht 5\' 3"  (1.6 m)   Wt 101 lb 3.2 oz (45.9 kg)   SpO2 98%   BMI 17.93 kg/m   BMI: Body mass index is 17.93 kg/m.  Physical Exam Vitals reviewed.  Constitutional:      Appearance: He is well-developed.  HENT:     Head: Normocephalic and atraumatic.  Eyes:     General:        Right eye: No discharge.        Left eye: No discharge.  Neck:     Vascular: No JVD.  Cardiovascular:     Rate and Rhythm: Normal rate and regular rhythm.     Pulses:          Posterior tibial pulses are 2+ on the right side and 2+ on the left side.     Heart sounds: Normal heart sounds, S1 normal and S2 normal. Heart sounds not distant. No midsystolic click and no opening snap. No murmur heard.    No friction rub.  Pulmonary:     Effort: Pulmonary effort is normal. No respiratory distress.     Breath sounds: Normal breath sounds. No decreased breath sounds, wheezing or rales.  Chest:     Chest wall: No tenderness.  Abdominal:     General: There is no distension.  Musculoskeletal:     Cervical back: Normal range of motion.     Right lower leg: No edema.     Left lower leg: No edema.  Skin:    General: Skin is warm and dry.     Nails: There is no clubbing.  Neurological:     Mental Status: He is alert and oriented to person, place, and time.  Psychiatric:        Speech: Speech normal.  Behavior: Behavior normal.        Thought Content: Thought content normal.        Judgment: Judgment normal.     Wt Readings from Last 3 Encounters:  02/22/23 101 lb 3.2 oz (45.9 kg)  02/15/23 105 lb (47.6 kg)  01/25/23 107 lb 3.2 oz (48.6 kg)     ASSESSMENT & PLAN:   CAD involving the native coronary arteries without angina: He is doing well and without symptoms of angina or cardiac  decompensation.  Continue aggressive risk factor modification and secondary prevention including apixaban, in the setting of A-fib, and clopidogrel with continuation of clopidogrel through at least 08/2023 with consideration for continuation of clopidogrel versus transitioning to aspirin at that time given history of left main stent.  He remains on atorvastatin and metoprolol.  No indication for further ischemic testing at this time.  HFrEF secondary to ICM: Euvolemic and well compensated with NYHA class I-II symptoms.  Not requiring a standing loop diuretic.  Remains on metoprolol succinate, spironolactone, losartan, and Jardiance.  Most recent echo from 11/2022 showed improvement in LV systolic function up to 45%.  Relative hypotension precludes further escalation of GDMT at this time including transition from ARB to ARNI.  CHF education.  PAF: A-sensed, V-paced rhythm on metoprolol.  CHA2DS2-VASc at least 3 (CHF, HTN, vascular disease).  Remains on apixaban 5 mg twice daily and does not meet reduced dosing criteria.  No falls or symptoms concerning for bleeding.  Recent Hgb stable.  Complete heart block: Status post CRT-P in 08/2022.  Managed by EP.  HTN: Blood pressure well-controlled in the office today.  Continue medical therapy as outlined above.  HLD: LDL 39 in 11/2022 with normal AST/ALT at that time.  Remains on atorvastatin 80 mg.  Hypokalemia: Potassium 3.4 on 02/15/2023, at which time he was initiated on KCl 20 mEq daily by outside office.  Currently on losartan and spironolactone.  Check BMP.  Language barrier: [place] telephone interpreter utilized for today's visit.   Disposition: F/u with Dr. Kirke Corin or an APP in 3 months.   Medication Adjustments/Labs and Tests Ordered: Current medicines are reviewed at length with the patient today.  Concerns regarding medicines are outlined above. Medication changes, Labs and Tests ordered today are summarized above and listed in the Patient  Instructions accessible in Encounters.   Signed, Eula Listen, PA-C 02/22/2023 12:50 PM     [facility] [place] [address] [place], [place] [address] [phone]

## 2023-02-18 ENCOUNTER — Ambulatory Visit (INDEPENDENT_AMBULATORY_CARE_PROVIDER_SITE_OTHER): Payer: 59

## 2023-02-18 DIAGNOSIS — I442 Atrioventricular block, complete: Secondary | ICD-10-CM

## 2023-02-19 LAB — CUP PACEART REMOTE DEVICE CHECK
Battery Remaining Longevity: 113 mo
Battery Voltage: 3.01 V
Brady Statistic AP VP Percent: 11.35 %
Brady Statistic AP VS Percent: 0.01 %
Brady Statistic AS VP Percent: 87.32 %
Brady Statistic AS VS Percent: 1.32 %
Brady Statistic RA Percent Paced: 11.64 %
Brady Statistic RV Percent Paced: 98.67 %
Date Time Interrogation Session: 20240915231551
Implantable Lead Connection Status: 753985
Implantable Lead Connection Status: 753985
Implantable Lead Connection Status: 753985
Implantable Lead Implant Date: 20240315
Implantable Lead Implant Date: 20240315
Implantable Lead Implant Date: 20240315
Implantable Lead Location: 753859
Implantable Lead Location: 753860
Implantable Lead Location: 753860
Implantable Lead Model: 3830
Implantable Lead Model: 5076
Implantable Lead Model: 5076
Implantable Pulse Generator Implant Date: 20240315
Lead Channel Impedance Value: 228 Ohm
Lead Channel Impedance Value: 304 Ohm
Lead Channel Impedance Value: 323 Ohm
Lead Channel Impedance Value: 342 Ohm
Lead Channel Impedance Value: 361 Ohm
Lead Channel Impedance Value: 380 Ohm
Lead Channel Impedance Value: 380 Ohm
Lead Channel Impedance Value: 456 Ohm
Lead Channel Impedance Value: 475 Ohm
Lead Channel Pacing Threshold Amplitude: 0.5 V
Lead Channel Pacing Threshold Amplitude: 0.625 V
Lead Channel Pacing Threshold Amplitude: 1.25 V
Lead Channel Pacing Threshold Pulse Width: 0.4 ms
Lead Channel Pacing Threshold Pulse Width: 0.4 ms
Lead Channel Pacing Threshold Pulse Width: 0.4 ms
Lead Channel Sensing Intrinsic Amplitude: 1.375 mV
Lead Channel Sensing Intrinsic Amplitude: 1.375 mV
Lead Channel Sensing Intrinsic Amplitude: 7.375 mV
Lead Channel Sensing Intrinsic Amplitude: 7.375 mV
Lead Channel Setting Pacing Amplitude: 1.5 V
Lead Channel Setting Pacing Amplitude: 1.75 V
Lead Channel Setting Pacing Amplitude: 2 V
Lead Channel Setting Pacing Pulse Width: 0.4 ms
Lead Channel Setting Pacing Pulse Width: 0.4 ms
Lead Channel Setting Sensing Sensitivity: 1.2 mV
Zone Setting Status: 755011
Zone Setting Status: 755011

## 2023-02-22 ENCOUNTER — Ambulatory Visit: Payer: 59 | Attending: Physician Assistant | Admitting: Physician Assistant

## 2023-02-22 ENCOUNTER — Encounter: Payer: Self-pay | Admitting: Physician Assistant

## 2023-02-22 VITALS — BP 106/70 | HR 70 | Ht 63.0 in | Wt 101.2 lb

## 2023-02-22 DIAGNOSIS — I5020 Unspecified systolic (congestive) heart failure: Secondary | ICD-10-CM

## 2023-02-22 DIAGNOSIS — E785 Hyperlipidemia, unspecified: Secondary | ICD-10-CM

## 2023-02-22 DIAGNOSIS — I2510 Atherosclerotic heart disease of native coronary artery without angina pectoris: Secondary | ICD-10-CM

## 2023-02-22 DIAGNOSIS — I255 Ischemic cardiomyopathy: Secondary | ICD-10-CM | POA: Diagnosis not present

## 2023-02-22 DIAGNOSIS — Z950 Presence of cardiac pacemaker: Secondary | ICD-10-CM

## 2023-02-22 DIAGNOSIS — I480 Paroxysmal atrial fibrillation: Secondary | ICD-10-CM

## 2023-02-22 DIAGNOSIS — Z603 Acculturation difficulty: Secondary | ICD-10-CM

## 2023-02-22 DIAGNOSIS — Z758 Other problems related to medical facilities and other health care: Secondary | ICD-10-CM

## 2023-02-22 DIAGNOSIS — E876 Hypokalemia: Secondary | ICD-10-CM

## 2023-02-22 DIAGNOSIS — I10 Essential (primary) hypertension: Secondary | ICD-10-CM

## 2023-02-22 DIAGNOSIS — I442 Atrioventricular block, complete: Secondary | ICD-10-CM

## 2023-02-22 LAB — BASIC METABOLIC PANEL
BUN/Creatinine Ratio: 28 — ABNORMAL HIGH (ref 10–24)
BUN: 24 mg/dL (ref 8–27)
CO2: 20 mmol/L (ref 20–29)
Calcium: 9.6 mg/dL (ref 8.6–10.2)
Chloride: 102 mmol/L (ref 96–106)
Creatinine, Ser: 0.86 mg/dL (ref 0.76–1.27)
Glucose: 84 mg/dL (ref 70–99)
Potassium: 4.5 mmol/L (ref 3.5–5.2)
Sodium: 138 mmol/L (ref 134–144)
eGFR: 99 mL/min/{1.73_m2} (ref >59)

## 2023-02-22 NOTE — Patient Instructions (Signed)
Medication Instructions:  Your Physician recommend you continue on your current medication as directed.    *If you need a refill on your cardiac medications before your next appointment, please call your pharmacy*   Lab Work: Your provider would like for you to have following labs drawn today BMT.   If you have labs (blood work) drawn today and your tests are completely normal, you will receive your results only by: MyChart Message (if you have MyChart) OR A paper copy in the mail If you have any lab test that is abnormal or we need to change your treatment, we will call you to review the results.   Follow-Up: At [facility], you and your health needs are our priority.  As part of our continuing mission to provide you with exceptional heart care, we have created designated Provider Care Teams.  These Care Teams include your primary Cardiologist (physician) and Advanced Practice Providers (APPs -  Physician Assistants and Nurse Practitioners) who all work together to provide you with the care you need, when you need it.  We recommend signing up for the patient portal called "MyChart".  Sign up information is provided on this After Visit Summary.  MyChart is used to connect with patients for Virtual Visits (Telemedicine).  Patients are able to view lab/test results, encounter notes, upcoming appointments, etc.  Non-urgent messages can be sent to your provider as well.   To learn more about what you can do with MyChart, go to [email or web address].    Your next appointment:   3 month(s)  Provider:   You may see Lorine Bears, MD or one of the following Advanced Practice Providers on your designated Care Team:   Eula Listen, [place]

## 2023-03-06 NOTE — Progress Notes (Signed)
Remote pacemaker transmission.   

## 2023-03-28 NOTE — Progress Notes (Signed)
PCP: Azzie Roup, FNP  Primary Cardiologist: Lorine Bears, MD (last seen 09/24)  HPI:  [place] interpreter via telephone was used for visit.   Craig Reid is a 62 y/o male with a history of CAD/ STEMI thought to be due to plaque rupture and heart failure, biventricular pacemaker placed on 08/17/22 due to CHB, PAF, hyperlipidemia, ICM and HTN.   Admitted 08/05/22 following a syncopal episode at work with a hospital CPR 1 minute gaining consciousness. Upon EMS arrival EKG showed anterior ST elevation MI. In the ED, evidence of bradycardia with intermittent high-grade AV block. Emergent LHC showed severe ostial left main stenosis with hazy appearance highly suggestive of acute plaque rupture. Otherwise, there was no other obstructive disease. There was severely reduced LV systolic function with an EF of 20% with akinesia of the mid to distal anterior, apical, and inferoapical myocardium. He underwent successful PCI/DES to the ostial left main. During the admission, he was evaluated by EP given bradycardia and high-grade AV block with 2-1 conduction with recommendation for for continued monitoring following PCI.   Subsequent outpatient cardiac monitor showed an average rate of 63 bpm with a range of 28 to 154 bpm, 1 run of NSVT lasting 7 beats, 7% A-fib/flutter burden with the longest episode lasting 9 hours and 15 minutes, 6226 episodes of high degree/third-degree AV block.  Biv pacemaker implanted 08/17/22.  Echo 08/06/22: EF 40-45% Echo 11/23/22: EF 40-45% along with Grade I DD, mild Craig  LHC 08/05/22:    Prox RCA lesion is 40% stenosed.   Ost LM to Mid LM lesion is 95% stenosed.   A drug-eluting stent was successfully placed using a STENT ONYX FRONTIER 3.5X12.   Post intervention, there is a 0% residual stenosis.   There is severe left ventricular systolic dysfunction.   LV end diastolic pressure is mildly elevated.   The left ventricular ejection fraction is less than 25% by visual  estimate.  1.  Severe ostial left main stenosis with hazy appearance highly suggestive of acute plaque rupture.  No other obstructive disease. 2.  Severely reduced LV systolic function with an EF of 20% with akinesia of the mid to distal anterior, apical and infero-Apical myocardium. 3.  Successful angioplasty and drug-eluting stent placement to the ostial left main coronary artery.  He presents today for a HF f/u visit with a chief complaint of continued sensation of feet feeling cold/ tingling mostly at night when he's trying to sleep. Also endorses some numbness in both feet as well. Does feel like the legs are better though. Has difficulty sleeping because of the issues with his feet and doesn't feel like it's any worse on the days that he works. Continues to have some palpitations but improving. Denies pain in feet, shortness of breath, chest pain, fatigue, cough, pedal edema, dizziness or weight gain.   At last visit, farxiga was changed to jardiance due to $0 copay. No longer taking potassium supplements.    ROS: All systems negative except as listed in HPI, PMH and Problem List.  SH:  Social History   Socioeconomic History   Marital status: Married    Spouse name: Not on file   Number of children: Not on file   Years of education: Not on file   Highest education level: Not on file  Occupational History   Not on file  Tobacco Use   Smoking status: Never   Smokeless tobacco: Never  Vaping Use   Vaping status: Never Used  Substance and  Sexual Activity   Alcohol use: Never   Drug use: Never   Sexual activity: Not on file  Other Topics Concern   Not on file  Social History Narrative   Not on file   Social Determinants of Health   Financial Resource Strain: Not on file  Food Insecurity: No Food Insecurity (08/06/2022)   Hunger Vital Sign    Worried About Running Out of Food in the Last Year: Never true    Ran Out of Food in the Last Year: Never true  Transportation Needs:  No Transportation Needs (08/06/2022)   PRAPARE - Administrator, Civil Service (Medical): No    Lack of Transportation (Non-Medical): No  Physical Activity: Insufficiently Active (12/11/2019)   Received from [facility], [facility]   Exercise Vital Sign    Days of Exercise per Week: 1 day    Minutes of Exercise per Session: 30 min  Stress: No Stress Concern Present (12/11/2019)   Received from [facility], [facility] of Occupational Health - Occupational Stress Questionnaire    Feeling of Stress : Not at all  Social Connections: Unknown (08/06/2022)   Social Connection and Isolation Panel [NHANES]    Frequency of Communication with Friends and Family: More than three times a week    Frequency of Social Gatherings with Friends and Family: More than three times a week    Attends Religious Services: 1 to 4 times per year    Active Member of [employer] or Organizations: No    Attends Banker Meetings: Never    Marital Status: Patient declined  Catering manager Violence: Not At Risk (08/06/2022)   Humiliation, Afraid, Rape, and Kick questionnaire    Fear of Current or Ex-Partner: No    Emotionally Abused: No    Physically Abused: No    Sexually Abused: No    FH: No family history on file.  Past Medical History:  Diagnosis Date   Complete heart block (HCC)    a. BiV PPM 3/24   Coronary artery disease    a. ant STEMI 3/24 ost LM 95% s/p PCI/DES to LM, pRCA 40%   Essential hypertension    Hyperlipidemia LDL goal <50    Ischemic cardiomyopathy    PAF (paroxysmal atrial fibrillation) (HCC)     Current Outpatient Medications  Medication Sig Dispense Refill   apixaban (ELIQUIS) 5 MG TABS tablet Take 1 tablet (5 mg total) by mouth 2 (two) times daily. 60 tablet 11   atorvastatin (LIPITOR) 80 MG tablet Take 1 tablet (80 mg total) by mouth daily. 90 tablet 3   clopidogrel (PLAVIX) 75 MG tablet Take 1 tablet (75 mg total) by mouth daily. 90 tablet  3   empagliflozin (JARDIANCE) 10 MG TABS tablet Take 1 tablet (10 mg total) by mouth daily before breakfast. 30 tablet 5   losartan (COZAAR) 25 MG tablet Take 1 tablet (25 mg total) by mouth at bedtime. 90 tablet 3   melatonin 5 MG TABS Take 5-10 mg by mouth at bedtime as needed.     metoprolol succinate (TOPROL XL) 25 MG 24 hr tablet Take 1 tablet (25 mg total) by mouth daily. 30 tablet 5   Multiple Vitamin (MULTIVITAMIN WITH MINERALS) TABS tablet Take 1 tablet by mouth daily.     pantoprazole (PROTONIX) 40 MG tablet Take 1 tablet (40 mg total) by mouth daily. 90 tablet 3   potassium chloride SA (KLOR-CON M) 20 MEQ tablet Take 1  tablet (20 mEq total) by mouth daily. 90 tablet 3   spironolactone (ALDACTONE) 25 MG tablet Take 0.5 tablets (12.5 mg total) by mouth daily. 15 tablet 3   No current facility-administered medications for this visit.   Vitals:   03/29/23 0933  BP: 127/75  Pulse: 68  Resp: 16  SpO2: 100%  Weight: 105 lb 2 oz (47.7 kg)   Wt Readings from Last 3 Encounters:  03/29/23 105 lb 2 oz (47.7 kg)  02/22/23 101 lb 3.2 oz (45.9 kg)  02/15/23 105 lb (47.6 kg)   Lab Results  Component Value Date   CREATININE 0.86 02/22/2023   CREATININE 0.74 02/15/2023   CREATININE 0.86 01/25/2023    PHYSICAL EXAM:  General:  Well appearing. No resp difficulty HEENT: normal Neck: supple. JVP flat. No lymphadenopathy or thryomegaly appreciated. Cor: PMI normal. Regular rate & rhythm. No rubs, gallops II/VI murmur LSB. Lungs: clear Abdomen: soft, nontender, nondistended. No hepatosplenomegaly.  Extremities: no cyanosis, clubbing, rash, edema. Bilateral feet warm. + PT/DP pulses bilaterally Neuro: alert & oriented x 3, cranial nerves grossly intact. Moves all 4 extremities w/o difficulty. Affect pleasant.   ECG: not done   ASSESSMENT & PLAN:  1: Ischemic heart failure with reduced ejection fraction- - likely CAD with cath results showing stenosis - NYHA class I -  euvolemic - weighing daily; reminded to call for an overnight weight gain of > 2 pounds or a weekly weight gain of > 5 pounds - weight unchanged from last visit here 5 weeks ago - BP 127/75 - echo 08/06/22: EF 40-45% - echo 11/23/22: EF 40-45% along with Grade I DD, mild Craig - CRT-P placed 08/17/22 - adds "very little" salt to his food - continue metoprolol succinate 25mg  daily  - continue losartan 25mg  daily at bedtime (has been hypotensive in the past so doubt can transition to entresto) - continue spironolactone 12.5mg  daily - continue jardiance 10mg  daily - can use tonic water to see if this helps with leg cramps - BNP 08/26/22 was 191.1  2: CAD- - saw cardiology (Dunn) 09/24 - continue plavix 75mg  daily - continue atorvastatin 80mg  QPM - LDL 11/16/22 was 39 - STEMI thought to be due to plaque rupture - LHC 08/05/22:   Prox RCA lesion is 40% stenosed.   Ost LM to Mid LM lesion is 95% stenosed.   A drug-eluting stent was successfully placed using a STENT ONYX FRONTIER 3.5X12.   Post intervention, there is a 0% residual stenosis.   There is severe left ventricular systolic dysfunction.   LV end diastolic pressure is mildly elevated.   The left ventricular ejection fraction is less than 25% by visual estimate.  Severe ostial left main stenosis with hazy appearance highly suggestive of acute plaque rupture.  No other obstructive disease. Severely reduced LV systolic function with an EF of 20% with akinesia of the mid to distal anterior, apical and infero-Apical myocardium. Successful angioplasty and drug-eluting stent placement to the ostial left main coronary artery.  3: CHB- - saw EP Graciela Husbands) 6/24; returns ~ 9 months - BIV pacemaker CRT-P placed 08/17/22 - BMP 02/22/23 showed sodium 138, potassium 4.5, creatinine 0.86 & GFR 99 - Mg 02/15/23 was 2.0  4: Paroxysmal Atrial fibrillation- - previous zio showed atrial fibrillation - continue apixaban 5mg  BID  5: Language barrier- - [facility] telephone interpreter used for today's visit   Return in 6 months, sooner if needed.

## 2023-03-29 ENCOUNTER — Ambulatory Visit: Payer: 59 | Attending: Family | Admitting: Family

## 2023-03-29 ENCOUNTER — Encounter: Payer: Self-pay | Admitting: Family

## 2023-03-29 VITALS — BP 127/75 | HR 68 | Resp 16 | Wt 105.1 lb

## 2023-03-29 DIAGNOSIS — Z955 Presence of coronary angioplasty implant and graft: Secondary | ICD-10-CM | POA: Diagnosis not present

## 2023-03-29 DIAGNOSIS — I5022 Chronic systolic (congestive) heart failure: Secondary | ICD-10-CM | POA: Insufficient documentation

## 2023-03-29 DIAGNOSIS — E785 Hyperlipidemia, unspecified: Secondary | ICD-10-CM | POA: Insufficient documentation

## 2023-03-29 DIAGNOSIS — I442 Atrioventricular block, complete: Secondary | ICD-10-CM

## 2023-03-29 DIAGNOSIS — I252 Old myocardial infarction: Secondary | ICD-10-CM | POA: Diagnosis not present

## 2023-03-29 DIAGNOSIS — I2510 Atherosclerotic heart disease of native coronary artery without angina pectoris: Secondary | ICD-10-CM | POA: Diagnosis present

## 2023-03-29 DIAGNOSIS — Z7901 Long term (current) use of anticoagulants: Secondary | ICD-10-CM | POA: Insufficient documentation

## 2023-03-29 DIAGNOSIS — Z79899 Other long term (current) drug therapy: Secondary | ICD-10-CM | POA: Insufficient documentation

## 2023-03-29 DIAGNOSIS — Z7902 Long term (current) use of antithrombotics/antiplatelets: Secondary | ICD-10-CM | POA: Diagnosis not present

## 2023-03-29 DIAGNOSIS — Z603 Acculturation difficulty: Secondary | ICD-10-CM

## 2023-03-29 DIAGNOSIS — I110 Hypertensive heart disease with heart failure: Secondary | ICD-10-CM | POA: Insufficient documentation

## 2023-03-29 DIAGNOSIS — Z950 Presence of cardiac pacemaker: Secondary | ICD-10-CM | POA: Insufficient documentation

## 2023-03-29 DIAGNOSIS — I480 Paroxysmal atrial fibrillation: Secondary | ICD-10-CM | POA: Diagnosis not present

## 2023-03-29 DIAGNOSIS — Z758 Other problems related to medical facilities and other health care: Secondary | ICD-10-CM

## 2023-03-29 DIAGNOSIS — I5020 Unspecified systolic (congestive) heart failure: Secondary | ICD-10-CM

## 2023-03-29 NOTE — Patient Instructions (Addendum)
Per your last lab results, make sure you do not take anymore potassium tablets.   It was good to see you today!

## 2023-03-30 ENCOUNTER — Other Ambulatory Visit: Payer: Self-pay | Admitting: Family

## 2023-04-01 ENCOUNTER — Other Ambulatory Visit: Payer: Self-pay | Admitting: Family

## 2023-04-06 ENCOUNTER — Other Ambulatory Visit: Payer: Self-pay | Admitting: Family

## 2023-05-20 ENCOUNTER — Ambulatory Visit (INDEPENDENT_AMBULATORY_CARE_PROVIDER_SITE_OTHER): Payer: 59

## 2023-05-20 DIAGNOSIS — I255 Ischemic cardiomyopathy: Secondary | ICD-10-CM

## 2023-05-20 DIAGNOSIS — I442 Atrioventricular block, complete: Secondary | ICD-10-CM

## 2023-05-20 LAB — CUP PACEART REMOTE DEVICE CHECK
Battery Remaining Longevity: 108 mo
Battery Voltage: 3 V
Brady Statistic AP VP Percent: 16.28 %
Brady Statistic AP VS Percent: 0.03 %
Brady Statistic AS VP Percent: 82.52 %
Brady Statistic AS VS Percent: 1.17 %
Brady Statistic RA Percent Paced: 16.48 %
Brady Statistic RV Percent Paced: 98.78 %
Date Time Interrogation Session: 20241215220616
Implantable Lead Connection Status: 753985
Implantable Lead Connection Status: 753985
Implantable Lead Connection Status: 753985
Implantable Lead Implant Date: 20240315
Implantable Lead Implant Date: 20240315
Implantable Lead Implant Date: 20240315
Implantable Lead Location: 753859
Implantable Lead Location: 753860
Implantable Lead Location: 753860
Implantable Lead Model: 3830
Implantable Lead Model: 5076
Implantable Lead Model: 5076
Implantable Pulse Generator Implant Date: 20240315
Lead Channel Impedance Value: 228 Ohm
Lead Channel Impedance Value: 304 Ohm
Lead Channel Impedance Value: 323 Ohm
Lead Channel Impedance Value: 342 Ohm
Lead Channel Impedance Value: 342 Ohm
Lead Channel Impedance Value: 380 Ohm
Lead Channel Impedance Value: 380 Ohm
Lead Channel Impedance Value: 418 Ohm
Lead Channel Impedance Value: 456 Ohm
Lead Channel Pacing Threshold Amplitude: 0.5 V
Lead Channel Pacing Threshold Amplitude: 0.625 V
Lead Channel Pacing Threshold Amplitude: 1.5 V
Lead Channel Pacing Threshold Pulse Width: 0.4 ms
Lead Channel Pacing Threshold Pulse Width: 0.4 ms
Lead Channel Pacing Threshold Pulse Width: 0.4 ms
Lead Channel Sensing Intrinsic Amplitude: 1 mV
Lead Channel Sensing Intrinsic Amplitude: 1 mV
Lead Channel Sensing Intrinsic Amplitude: 10.25 mV
Lead Channel Sensing Intrinsic Amplitude: 10.25 mV
Lead Channel Setting Pacing Amplitude: 1.5 V
Lead Channel Setting Pacing Amplitude: 2 V
Lead Channel Setting Pacing Amplitude: 2 V
Lead Channel Setting Pacing Pulse Width: 0.4 ms
Lead Channel Setting Pacing Pulse Width: 0.4 ms
Lead Channel Setting Sensing Sensitivity: 1.2 mV
Zone Setting Status: 755011
Zone Setting Status: 755011

## 2023-06-14 ENCOUNTER — Encounter: Payer: Self-pay | Admitting: Physician Assistant

## 2023-06-14 ENCOUNTER — Ambulatory Visit: Payer: BLUE CROSS/BLUE SHIELD | Attending: Physician Assistant | Admitting: Physician Assistant

## 2023-06-14 VITALS — BP 108/88 | HR 67 | Ht 63.0 in | Wt 108.2 lb

## 2023-06-14 DIAGNOSIS — I10 Essential (primary) hypertension: Secondary | ICD-10-CM

## 2023-06-14 DIAGNOSIS — I480 Paroxysmal atrial fibrillation: Secondary | ICD-10-CM

## 2023-06-14 DIAGNOSIS — E785 Hyperlipidemia, unspecified: Secondary | ICD-10-CM

## 2023-06-14 DIAGNOSIS — I255 Ischemic cardiomyopathy: Secondary | ICD-10-CM

## 2023-06-14 DIAGNOSIS — I2510 Atherosclerotic heart disease of native coronary artery without angina pectoris: Secondary | ICD-10-CM

## 2023-06-14 DIAGNOSIS — Z758 Other problems related to medical facilities and other health care: Secondary | ICD-10-CM

## 2023-06-14 DIAGNOSIS — I442 Atrioventricular block, complete: Secondary | ICD-10-CM

## 2023-06-14 DIAGNOSIS — I5020 Unspecified systolic (congestive) heart failure: Secondary | ICD-10-CM | POA: Diagnosis not present

## 2023-06-14 DIAGNOSIS — Z950 Presence of cardiac pacemaker: Secondary | ICD-10-CM

## 2023-06-14 DIAGNOSIS — Z603 Acculturation difficulty: Secondary | ICD-10-CM

## 2023-06-14 NOTE — Patient Instructions (Signed)
 Medication Instructions:  Your Physician recommend you continue on your current medication as directed.    *If you need a refill on your cardiac medications before your next appointment, please call your pharmacy*   Lab Work: None ordered at this time  If you have labs (blood work) drawn today and your tests are completely normal, you will receive your results only by: MyChart Message (if you have MyChart) OR A paper copy in the mail If you have any lab test that is abnormal or we need to change your treatment, we will call you to review the results.   Testing/Procedures: None ordered at this time    Follow-Up: At [facility], you and your health needs are our priority.  As part of our continuing mission to provide you with exceptional heart care, we have created designated Provider Care Teams.  These Care Teams include your primary Cardiologist (physician) and Advanced Practice Providers (APPs -  Physician Assistants and Nurse Practitioners) who all work together to provide you with the care you need, when you need it.  We recommend signing up for the patient portal called MyChart.  Sign up information is provided on this After Visit Summary.  MyChart is used to connect with patients for Virtual Visits (Telemedicine).  Patients are able to view lab/test results, encounter notes, upcoming appointments, etc.  Non-urgent messages can be sent to your provider as well.   To learn more about what you can do with MyChart, go to [email or web address].    Your next appointment:   3 month(s)  Provider:   You may see Deatrice Cage, MD or one of the following Advanced Practice Providers on your designated Care Team:   Lonni Meager, NP Bernardino Bring, PA-C Cadence Franchester, PA-C Tylene Lunch, NP Barnie Hila, NP

## 2023-06-14 NOTE — Progress Notes (Signed)
 Cardiology Office Note    Date:  06/14/2023   ID:  Craig Reid, DOB July 09, 1960, MRN [id number]  PCP:  [name], FNP  Cardiologist:  Deatrice Cage, MD  Electrophysiologist:  OLE ONEIDA HOLTS, MD   Chief Complaint: Follow-up  History of Present Illness:   Craig Reid is a 63 y.o. male with history of CAD with an anterior STEMI s/p PCI/DES to the left main in 08/2022, HFrEF secondary ICM, syncope with complete heart block s/p BiV PPM in 08/2022, PAF on Eliquis , HTN, and HLD who presents for follow-up of his CAD, cardiomyopathy, and PAF.   He was admitted to [facility] in 08/2022 following a syncopal episode at work with a hospital CPR 1 minute gaining consciousness.  Upon EMS arrival EKG showed anterior ST elevation MI.  In the ED, evidence of bradycardia with intermittent high-grade AV block.  Emergent LHC showed severe ostial left main stenosis with hazy appearance highly suggestive of acute plaque rupture.  Otherwise, there was no other obstructive disease.  There was severely reduced LV systolic function with an EF of 20% with akinesia of the mid to distal anterior, apical, and inferoapical myocardium.  He underwent successful PCI/DES to the ostial left main.  During the admission, he was evaluated by EP given bradycardia and high-grade AV block with 2-1 conduction with recommendation for for continued monitoring following PCI.  Subsequent outpatient cardiac monitor showed an average rate of 63 bpm with a range of 28 to 154 bpm, 1 run of NSVT lasting 7 beats, 7% A-fib/flutter burden with the longest episode lasting 9 hours and 15 minutes, 6226 episodes of high degree/third-degree AV block.  Patient subsequently underwent CRT-P on 08/17/2022.  After discussion with interventional cardiology and EP, the patient was subsequently placed on apixaban  with transition from Effient  to clopidogrel  and discontinuation of aspirin  given documented A-fib on outpatient cardiac monitor.  He was  seen in 11/2022, and was no longer on losartan , having ran out of this prior to his CHF clinic appointment.  Patient and family preferred to defer reinitiation of ARB, or escalation of GDMT.  Limited echo on 11/23/2022 showed an EF of 40 to 45%, hypokinesis of the basal to mid septal, anteroseptal, and basal to mid inferior regions, grade 1 diastolic dysfunction, normal RV systolic function and ventricular cavity size, mild mitral regurgitation, and an estimated right atrial pressure of 3 mmHg.  Losartan  was subsequently reinitiated by EP.  GDMT was further escalated with addition of spironolactone  in 12/2022.  He was last seen in our office in 02/2023 and was without symptoms of angina or cardiac decompensation.  Relative hypotension precluded escalation of GDMT.  He comes in doing well today and is without symptoms of angina or cardiac decompensation.  No dyspnea, palpitations, dizziness, presyncope, or syncope.  No falls, hematochezia, or melena.  No lower extremity swelling, abdominal distention, or orthopnea.  Weight remains stable (weight is up 7 pounds by our scale today, though he is wearing multiple layers with projected temperatures outside).  He remains adherent and is tolerating apixaban  and clopidogrel .  He does not have any acute cardiac concerns at this time.   Labs independently reviewed: 06/2023 - potassium 3.7, BUN 24, serum creatinine 0.8, albumin 4.6, AST/ALT normal, TC 119, TG 88, HDL 58, LDL 43, A1c 4.6, Hgb 14.2, PLT 198 08/2022 - TSH normal, LP(a) 99.8   Past Medical History:  Diagnosis Date   Complete heart block (HCC)    a. BiV PPM 3/24   Coronary  artery disease    a. ant STEMI 3/24 ost LM 95% s/p PCI/DES to LM, pRCA 40%   Essential hypertension    Hyperlipidemia LDL goal <50    Ischemic cardiomyopathy    PAF (paroxysmal atrial fibrillation) (HCC)     Past Surgical History:  Procedure Laterality Date   BIV PACEMAKER INSERTION CRT-P N/A 08/17/2022   Procedure: BIV PACEMAKER  INSERTION CRT-P;  Surgeon: Fernande Elspeth BROCKS, MD;  Location: [facility] INVASIVE CV LAB;  Service: Cardiovascular;  Laterality: N/A;   CORONARY/GRAFT ACUTE MI REVASCULARIZATION N/A 08/05/2022   Procedure: Coronary/Graft Acute MI Revascularization;  Surgeon: Darron Deatrice LABOR, MD;  Location: [facility] INVASIVE CV LAB;  Service: Cardiovascular;  Laterality: N/A;   LAPAROTOMY N/A 04/21/2019   Procedure: EXPLORATORY LAPAROTOMY, gastric ulcer repair;  Surgeon: Tye Millet, DO;  Location: [facility] ORS;  Service: General;  Laterality: N/A;   LEFT HEART CATH AND CORONARY ANGIOGRAPHY N/A 08/05/2022   Procedure: LEFT HEART CATH AND CORONARY ANGIOGRAPHY;  Surgeon: Darron Deatrice LABOR, MD;  Location: [facility] INVASIVE CV LAB;  Service: Cardiovascular;  Laterality: N/A;    Current Medications: Current Meds  Medication Sig   apixaban  (ELIQUIS ) 5 MG TABS tablet Take 1 tablet (5 mg total) by mouth 2 (two) times daily.   atorvastatin  (LIPITOR ) 80 MG tablet Take 1 tablet (80 mg total) by mouth daily.   clopidogrel  (PLAVIX ) 75 MG tablet Take 1 tablet (75 mg total) by mouth daily.   empagliflozin  (JARDIANCE ) 10 MG TABS tablet Take 1 tablet (10 mg total) by mouth daily before breakfast.   losartan  (COZAAR ) 25 MG tablet Take 1 tablet (25 mg total) by mouth at bedtime.   melatonin 5 MG TABS Take 5-10 mg by mouth at bedtime as needed.   metoprolol  succinate (TOPROL -XL) 25 MG 24 hr tablet Take 1 tablet by mouth once daily   Multiple Vitamin (MULTIVITAMIN WITH MINERALS) TABS tablet Take 1 tablet by mouth daily.   pantoprazole  (PROTONIX ) 40 MG tablet Take 1 tablet (40 mg total) by mouth daily.   spironolactone  (ALDACTONE ) 25 MG tablet Take 1/2 (one-half) tablet by mouth once daily    Allergies:   Patient has no known allergies.   Social History   Socioeconomic History   Marital status: Married    Spouse name: Not on file   Number of children: Not on file   Years of education: Not on file   Highest education level: Not on file   Occupational History   Not on file  Tobacco Use   Smoking status: Never   Smokeless tobacco: Never  Vaping Use   Vaping status: Never Used  Substance and Sexual Activity   Alcohol use: Never   Drug use: Never   Sexual activity: Not on file  Other Topics Concern   Not on file  Social History Narrative   Not on file   Social Drivers of Health   Financial Resource Strain: Low Risk  (06/04/2023)   Received from [facility]   Overall Financial Resource Strain (CARDIA)    Difficulty of Paying Living Expenses: Not hard at all  Food Insecurity: No Food Insecurity (06/04/2023)   Received from [facility]   Hunger Vital Sign    Worried About Running Out of Food in the Last Year: Never true    Ran Out of Food in the Last Year: Never true  Transportation Needs: No Transportation Needs (06/04/2023)   Received from [facility]   [facility] - Transportation  In the past 12 months, has lack of transportation kept you from medical appointments or from getting medications?: No    Lack of Transportation (Non-Medical): No  Physical Activity: Insufficiently Active (12/11/2019)   [facility]   Exercise Vital Sign    Days of Exercise per Week: 1 day    Minutes of Exercise per Session: 30 min  Stress: No Stress Concern Present (12/11/2019)   Received from [facility] - Occupational Stress Questionnaire    Feeling of Stress : Not at all  Social Connections: Unknown (08/06/2022)   Social Connection and Isolation Panel [NHANES]    Frequency of Communication with Friends and Family: More than three times a week    Frequency of Social Gatherings with Friends and Family: More than three times a week    Attends Religious Services: 1 to 4 times per year    Active Member of [employer] or Organizations: No    Attends Banker Meetings: Never    Marital  Status: Patient declined     Family History:  The patient's family history is not on file.  ROS:   12-point review of systems is negative unless otherwise noted in the HPI.   EKGs/Labs/Other Studies Reviewed:    Studies reviewed were summarized above. The additional studies were reviewed today:  Limited echo 11/23/2022: 1. Left ventricular ejection fraction, by estimation, is 40 to 45%. Left  ventricular ejection fraction by 2D MOD biplane is 44.3 %. The left  ventricle has mildly decreased function. The left ventricle demonstrates  regional wall motion abnormalities  (Hypokinesis of the basal to mid septal, anteroseptal, and basal to mid  inferior regions ). Left ventricular diastolic parameters are consistent  with Grade I diastolic dysfunction (impaired relaxation). The average left  ventricular global longitudinal  strain is -14.0 %.   2. Right ventricular systolic function is normal. The right ventricular  size is normal. Tricuspid regurgitation signal is inadequate for assessing  PA pressure.   3. The mitral valve is normal in structure. Mild mitral valve  regurgitation. No evidence of mitral stenosis.   4. The aortic valve is normal in structure. Aortic valve regurgitation is  not visualized. No aortic stenosis is present.   5. The inferior vena cava is normal in size with greater than 50%  respiratory variability, suggesting right atrial pressure of 3 mmHg.  __________   Zio patch 08/2022: HR 28 - 154 bpm, average 63 bpm. 1 nonsustained VT lasting 7 beats. 7% AF/AFL burden, longest episode lasting 9hr47min. 6226 episodes of high degree/3rd degree AV block. Rare supraventricular ectopy. Rare ventricular ectopy.   Patient is s/p PPM with Dr Fernande. __________   2D echo 08/06/2022: 1. Left ventricular ejection fraction, by estimation, is 40 to 45%. The  left ventricle has mildly decreased function. The left ventricle  demonstrates global hypokinesis. Left ventricular  diastolic parameters are  indeterminate.   2. Right ventricular systolic function is normal. The right ventricular  size is normal. There is normal pulmonary artery systolic pressure.   3. The mitral valve is normal in structure. Mild mitral valve  regurgitation. No evidence of mitral stenosis.   4. The aortic valve is normal in structure. Aortic valve regurgitation is  trivial. Aortic valve sclerosis is present, with no evidence of aortic  valve stenosis.  __________   LHC 08/05/2022:   Prox RCA lesion is 40% stenosed.   Ost LM to Mid  LM lesion is 95% stenosed.   A drug-eluting stent was successfully placed using a STENT ONYX FRONTIER 3.5X12.   Post intervention, there is a 0% residual stenosis.   There is severe left ventricular systolic dysfunction.   LV end diastolic pressure is mildly elevated.   The left ventricular ejection fraction is less than 25% by visual estimate.   1.  Severe ostial left main stenosis with hazy appearance highly suggestive of acute plaque rupture.  No other obstructive disease. 2.  Severely reduced LV systolic function with an EF of 20% with akinesia of the mid to distal anterior, apical and infero-Apical myocardium. 3.  Successful angioplasty and drug-eluting stent placement to the ostial left main coronary artery.   Recommendations: Dual antiplatelet therapy for at least 12 months. No beta-blockers for now due to bradycardia.  Monitor heart rate and blood pressure closely as the patient is at risk for decompensation given degree of myocardium at risk. Delays and door to device time related to language barrier and the difficulty of obtaining an interpreter.   EKG:  EKG is ordered today.  The EKG ordered today demonstrates A-sensed, V-paced, 67 bpm  Recent Labs: 08/06/2022: B Natriuretic Peptide 191.1 08/07/2022: TSH 2.107 10/19/2022: Hemoglobin 13.1; Platelets 183 11/16/2022: ALT 39 02/15/2023: Magnesium 2.0 02/22/2023: BUN 24; Creatinine, Ser 0.86;  Potassium 4.5; Sodium 138  Recent Lipid Panel    Component Value Date/Time   CHOL 107 11/16/2022 1220   TRIG 87 11/16/2022 1220   HDL 51 11/16/2022 1220   CHOLHDL 2.1 11/16/2022 1220   VLDL 17 11/16/2022 1220   LDLCALC 39 11/16/2022 1220   LDLDIRECT 41 11/16/2022 1220    PHYSICAL EXAM:    VS:  BP 108/88 (BP Location: Left Arm, Patient Position: Sitting, Cuff Size: Normal)   Pulse 67   Ht 5' 3 (1.6 m)   Wt 108 lb 3.2 oz (49.1 kg)   SpO2 99%   BMI 19.17 kg/m   BMI: Body mass index is 19.17 kg/m.  Physical Exam Vitals reviewed.  Constitutional:      Appearance: He is well-developed.  HENT:     Head: Normocephalic and atraumatic.  Eyes:     General:        Right eye: No discharge.        Left eye: No discharge.  Neck:     Vascular: No JVD.  Cardiovascular:     Rate and Rhythm: Normal rate and regular rhythm.     Pulses:          Dorsalis pedis pulses are 2+ on the right side and 2+ on the left side.       Posterior tibial pulses are 2+ on the right side and 2+ on the left side.     Heart sounds: Normal heart sounds, S1 normal and S2 normal. Heart sounds not distant. No midsystolic click and no opening snap. No murmur heard.    No friction rub.  Pulmonary:     Effort: Pulmonary effort is normal. No respiratory distress.     Breath sounds: Normal breath sounds. No decreased breath sounds, wheezing, rhonchi or rales.  Chest:     Chest wall: No tenderness.  Abdominal:     General: There is no distension.  Musculoskeletal:     Cervical back: Normal range of motion.     Right lower leg: No edema.     Left lower leg: No edema.  Skin:    General: Skin is warm and dry.  Nails: There is no clubbing.  Neurological:     Mental Status: He is alert and oriented to person, place, and time.  Psychiatric:        Speech: Speech normal.        Behavior: Behavior normal.        Thought Content: Thought content normal.        Judgment: Judgment normal.     Wt Readings  from Last 3 Encounters:  06/14/23 108 lb 3.2 oz (49.1 kg)  03/29/23 105 lb 2 oz (47.7 kg)  02/22/23 101 lb 3.2 oz (45.9 kg)     ASSESSMENT & PLAN:   CAD involving the native coronary arteries without angina: He continues to do very well and is without symptoms of angina or cardiac decompensation.  Continue aggressive risk factor modification and secondary prevention including apixaban  in the place of aspirin  given A-fib, and clopidogrel , with continuation of clopidogrel  through at least 08/2023.  At that time, would consider continuation of clopidogrel  versus transitioning to aspirin , with continuation of apixaban , given history of left main stent.  He remains on atorvastatin  and metoprolol .  No indication for further ischemic testing at this time.  HFrEF secondary to ICM: He is euvolemic and well compensated with NYHA class I-II symptoms.  Not requiring a standing loop diuretic.  Remains on Toprol  succinate 25 mg daily, losartan  20 mg daily.  Most recent echo showed improvement in LV systolic function up to 45%.  Relative hypotension precludes further escalation of GDMT at this time, including transition of ARB to ARNI.  CHF education.  PAF: A-sensed, V-paced rhythm on metoprolol .  CHA2DS2-VASc at least 3 (CHF, HTN, vascular disease).  He remains on apixaban  5 mg twice daily and does not meet reduced dosing criteria.  No falls or symptoms concerning for bleeding.  Recent hemoglobin stable.  Complete heart block: Status post CRT-P in 08/2022.  Managed by EP.  HTN: Blood pressure is well-controlled in the office today.  Continue medical therapy as outlined above.  HLD: LDL 39 in 11/2022 with normal AST/ALT at that time.  He remains on atorvastatin  mg.  Language barrier: [place] video interpreter (audio portion only) utilized for today's visit.    Disposition: F/u with Dr. Darron or an APP in 3 months.   Medication Adjustments/Labs and Tests Ordered: Current medicines are reviewed at length  with the patient today.  Concerns regarding medicines are outlined above. Medication changes, Labs and Tests ordered today are summarized above and listed in the Patient Instructions accessible in Encounters.   Signed, Bernardino Bring, PA-C 06/14/2023 9:34 AM     [facility] [place] [address] [place], [place] [address] [phone]

## 2023-06-25 NOTE — Progress Notes (Signed)
Remote pacemaker transmission.   

## 2023-07-20 ENCOUNTER — Other Ambulatory Visit: Payer: Self-pay | Admitting: Family

## 2023-08-01 ENCOUNTER — Other Ambulatory Visit: Payer: Self-pay | Admitting: Family

## 2023-08-19 ENCOUNTER — Ambulatory Visit (INDEPENDENT_AMBULATORY_CARE_PROVIDER_SITE_OTHER): Payer: 59

## 2023-08-19 DIAGNOSIS — I442 Atrioventricular block, complete: Secondary | ICD-10-CM | POA: Diagnosis not present

## 2023-08-21 LAB — CUP PACEART REMOTE DEVICE CHECK
Battery Remaining Longevity: 105 mo
Battery Voltage: 2.99 V
Brady Statistic AP VP Percent: 12.89 %
Brady Statistic AP VS Percent: 0.01 %
Brady Statistic AS VP Percent: 86.68 %
Brady Statistic AS VS Percent: 0.41 %
Brady Statistic RA Percent Paced: 12.97 %
Brady Statistic RV Percent Paced: 99.57 %
Date Time Interrogation Session: 20250316210018
Implantable Lead Connection Status: 753985
Implantable Lead Connection Status: 753985
Implantable Lead Connection Status: 753985
Implantable Lead Implant Date: 20240315
Implantable Lead Implant Date: 20240315
Implantable Lead Implant Date: 20240315
Implantable Lead Location: 753859
Implantable Lead Location: 753860
Implantable Lead Location: 753860
Implantable Lead Model: 3830
Implantable Lead Model: 5076
Implantable Lead Model: 5076
Implantable Pulse Generator Implant Date: 20240315
Lead Channel Impedance Value: 228 Ohm
Lead Channel Impedance Value: 304 Ohm
Lead Channel Impedance Value: 342 Ohm
Lead Channel Impedance Value: 342 Ohm
Lead Channel Impedance Value: 342 Ohm
Lead Channel Impedance Value: 380 Ohm
Lead Channel Impedance Value: 399 Ohm
Lead Channel Impedance Value: 418 Ohm
Lead Channel Impedance Value: 456 Ohm
Lead Channel Pacing Threshold Amplitude: 0.5 V
Lead Channel Pacing Threshold Amplitude: 0.625 V
Lead Channel Pacing Threshold Amplitude: 1.5 V
Lead Channel Pacing Threshold Pulse Width: 0.4 ms
Lead Channel Pacing Threshold Pulse Width: 0.4 ms
Lead Channel Pacing Threshold Pulse Width: 0.4 ms
Lead Channel Sensing Intrinsic Amplitude: 0.875 mV
Lead Channel Sensing Intrinsic Amplitude: 0.875 mV
Lead Channel Sensing Intrinsic Amplitude: 10.875 mV
Lead Channel Sensing Intrinsic Amplitude: 10.875 mV
Lead Channel Setting Pacing Amplitude: 1.5 V
Lead Channel Setting Pacing Amplitude: 2 V
Lead Channel Setting Pacing Amplitude: 2 V
Lead Channel Setting Pacing Pulse Width: 0.4 ms
Lead Channel Setting Pacing Pulse Width: 0.4 ms
Lead Channel Setting Sensing Sensitivity: 1.2 mV
Zone Setting Status: 755011
Zone Setting Status: 755011

## 2023-09-05 ENCOUNTER — Other Ambulatory Visit: Payer: Self-pay | Admitting: Physician Assistant

## 2023-09-05 DIAGNOSIS — I480 Paroxysmal atrial fibrillation: Secondary | ICD-10-CM

## 2023-09-05 NOTE — Telephone Encounter (Signed)
 Prescription refill request for Eliquis received. Indication: Afib  Last office visit: 06/14/23 ([place])  Scr: 0.8 (06/12/23)  Age: 63 Weight: 49.1kg  Appropriate dose. Refill sent.

## 2023-09-16 ENCOUNTER — Encounter: Payer: Self-pay | Admitting: Internal Medicine

## 2023-09-19 NOTE — Progress Notes (Signed)
 Cardiology Office Note    Date:  09/20/2023   ID:  Craig Reid, DOB 03/24/1961, MRN [id number]  PCP:  Garnet Just, FNP  Cardiologist:  Antionette Kirks, MD  Electrophysiologist:  Boyce Byes, MD   Chief Complaint: Follow up  History of Present Illness:   Craig Reid is a 63 y.o. male with history of CAD with an anterior STEMI s/p PCI/DES to the left main in 08/2022, HFrEF secondary ICM, syncope with complete heart block s/p BiV PPM in 08/2022, PAF on Eliquis , HTN, and HLD who presents for follow-up of his CAD, cardiomyopathy, and PAF.   He was admitted to [facility] in 08/2022 following a syncopal episode at work with a hospital CPR 1 minute gaining consciousness.  Upon EMS arrival EKG showed anterior ST elevation MI.  In the ED, evidence of bradycardia with intermittent high-grade AV block.  Emergent LHC showed severe ostial left main stenosis with hazy appearance highly suggestive of acute plaque rupture.  Otherwise, there was no other obstructive disease.  There was severely reduced LV systolic function with an EF of 20% with akinesia of the mid to distal anterior, apical, and inferoapical myocardium.  He underwent successful PCI/DES to the ostial left main.  During the admission, he was evaluated by EP given bradycardia and high-grade AV block with 2-1 conduction with recommendation for for continued monitoring following PCI.  Subsequent outpatient cardiac monitor showed an average rate of 63 bpm with a range of 28 to 154 bpm, 1 run of NSVT lasting 7 beats, 7% A-fib/flutter burden with the longest episode lasting 9 hours and 15 minutes, 6226 episodes of high degree/third-degree AV block.  Patient subsequently underwent CRT-P on 08/17/2022.  After discussion with interventional cardiology and EP, the patient was subsequently placed on apixaban  with transition from Effient  to clopidogrel  and discontinuation of aspirin  given documented A-fib on outpatient cardiac monitor.  He was  seen in 11/2022, and was no longer on losartan , having ran out of this prior to his CHF clinic appointment.  Patient and family preferred to defer reinitiation of ARB, or escalation of GDMT.  Limited echo on 11/23/2022 showed an EF of 40 to 45%, hypokinesis of the basal to mid septal, anteroseptal, and basal to mid inferior regions, grade 1 diastolic dysfunction, normal RV systolic function and ventricular cavity size, mild mitral regurgitation, and an estimated right atrial pressure of 3 mmHg.  Losartan  was subsequently reinitiated by EP.  GDMT was further escalated with addition of spironolactone  in 12/2022.  He was last seen in the office in 06/2023 and was doing well from a cardiac perspective.  Relative hypotension continue to preclude escalation of GDMT.  He comes in doing well today and is without symptoms of angina or cardiac decompensation.  No dyspnea, palpitations, dizziness, presyncope, or syncope.  No falls, hematochezia, or melena.  No lower extremity swelling or progressive orthopnea.  Weight stable.  In reviewing his medications individually with assistance from medical interpreter, he has been out of atorvastatin  for approximately 6 weeks.  He reports he has been adherent to all other medications including apixaban  twice daily and clopidogrel  once daily without any missed doses.  He does not have any acute cardiac concerns at this time.   Labs independently reviewed: 06/2023 - potassium 3.7, BUN 24, serum creatinine 0.8, albumin 4.6, AST/ALT normal, TC 119, TG 88, HDL 58, LDL 43, A1c 4.6, Hgb 14.2, PLT 198 08/2022 - TSH normal, LP(a) 99.8  Past Medical History:  Diagnosis Date  Complete heart block (HCC)    a. BiV PPM 3/24   Coronary artery disease    a. ant STEMI 3/24 ost LM 95% s/p PCI/DES to LM, pRCA 40%   Essential hypertension    Hyperlipidemia LDL goal <50    Ischemic cardiomyopathy    PAF (paroxysmal atrial fibrillation) (HCC)     Past Surgical History:  Procedure Laterality  Date   BIV PACEMAKER INSERTION CRT-P N/A 08/17/2022   Procedure: BIV PACEMAKER INSERTION CRT-P;  Surgeon: Verona Goodwill, MD;  Location: [facility] INVASIVE CV LAB;  Service: Cardiovascular;  Laterality: N/A;   CORONARY/GRAFT ACUTE MI REVASCULARIZATION N/A 08/05/2022   Procedure: Coronary/Graft Acute MI Revascularization;  Surgeon: Wenona Hamilton, MD;  Location: [facility] INVASIVE CV LAB;  Service: Cardiovascular;  Laterality: N/A;   LAPAROTOMY N/A 04/21/2019   Procedure: EXPLORATORY LAPAROTOMY, gastric ulcer repair;  Surgeon: Conrado Delay, DO;  Location: [facility] ORS;  Service: General;  Laterality: N/A;   LEFT HEART CATH AND CORONARY ANGIOGRAPHY N/A 08/05/2022   Procedure: LEFT HEART CATH AND CORONARY ANGIOGRAPHY;  Surgeon: Wenona Hamilton, MD;  Location: [facility] INVASIVE CV LAB;  Service: Cardiovascular;  Laterality: N/A;    Current Medications: Current Meds  Medication Sig   empagliflozin  (JARDIANCE ) 10 MG TABS tablet TAKE 1 TABLET BY MOUTH ONCE DAILY BEFORE BREAKFAST   melatonin 5 MG TABS Take 5-10 mg by mouth at bedtime as needed.   Melatonin-Pyridoxine 5-1 MG TABS Take by mouth.   Multiple Vitamin (MULTIVITAMIN WITH MINERALS) TABS tablet Take 1 tablet by mouth daily.   [DISCONTINUED] apixaban  (ELIQUIS ) 5 MG TABS tablet Take 1 tablet by mouth twice daily   [DISCONTINUED] clopidogrel  (PLAVIX ) 75 MG tablet Take 1 tablet (75 mg total) by mouth daily.   [DISCONTINUED] losartan  (COZAAR ) 25 MG tablet Take 1 tablet (25 mg total) by mouth at bedtime.   [DISCONTINUED] metoprolol  succinate (TOPROL -XL) 25 MG 24 hr tablet Take 1 tablet by mouth once daily   [DISCONTINUED] spironolactone  (ALDACTONE ) 25 MG tablet Take 1/2 (one-half) tablet by mouth once daily    Allergies:   Patient has no known allergies.   Social History   Socioeconomic History   Marital status: Married    Spouse name: Not on file   Number of children: Not on file   Years of education: Not on file   Highest education level: Not on file   Occupational History   Not on file  Tobacco Use   Smoking status: Never   Smokeless tobacco: Never  Vaping Use   Vaping status: Never Used  Substance and Sexual Activity   Alcohol use: Never   Drug use: Never   Sexual activity: Not on file  Other Topics Concern   Not on file  Social History Narrative   Not on file   Social Drivers of Health   Financial Resource Strain: Low Risk  (06/04/2023)   Received from [facility]   Overall Financial Resource Strain (CARDIA)    Difficulty of Paying Living Expenses: Not hard at all  Food Insecurity: No Food Insecurity (06/04/2023)   Received from [facility]   Hunger Vital Sign    Worried About Running Out of Food in the Last Year: Never true    Ran Out of Food in the Last Year: Never true  Transportation Needs: No Transportation Needs (06/04/2023)   Received from [facility] - Transportation    In the past 12 months, has lack of transportation kept  you from medical appointments or from getting medications?: No    Lack of Transportation (Non-Medical): No  Physical Activity: Insufficiently Active (12/11/2019)   Received from [facility], [facility]   Exercise Vital Sign    Days of Exercise per Week: 1 day    Minutes of Exercise per Session: 30 min  Stress: No Stress Concern Present (12/11/2019)   Received from [facility], [facility] - Occupational Stress Questionnaire    Feeling of Stress : Not at all  Social Connections: Unknown (08/06/2022)   Social Connection and Isolation Panel [NHANES]    Frequency of Communication with Friends and Family: More than three times a week    Frequency of Social Gatherings with Friends and Family: More than three times a week    Attends Religious Services: 1 to 4 times per year    Active Member of [employer] or Organizations: No    Attends Banker Meetings: Never    Marital  Status: Patient declined     Family History:  The patient's family history is not on file.  ROS:   12-point review of systems is negative unless otherwise noted in the HPI.   EKGs/Labs/Other Studies Reviewed:    Studies reviewed were summarized above. The additional studies were reviewed today:  Limited echo 11/23/2022: 1. Left ventricular ejection fraction, by estimation, is 40 to 45%. Left  ventricular ejection fraction by 2D MOD biplane is 44.3 %. The left  ventricle has mildly decreased function. The left ventricle demonstrates  regional wall motion abnormalities  (Hypokinesis of the basal to mid septal, anteroseptal, and basal to mid  inferior regions ). Left ventricular diastolic parameters are consistent  with Grade I diastolic dysfunction (impaired relaxation). The average left  ventricular global longitudinal  strain is -14.0 %.   2. Right ventricular systolic function is normal. The right ventricular  size is normal. Tricuspid regurgitation signal is inadequate for assessing  PA pressure.   3. The mitral valve is normal in structure. Mild mitral valve  regurgitation. No evidence of mitral stenosis.   4. The aortic valve is normal in structure. Aortic valve regurgitation is  not visualized. No aortic stenosis is present.   5. The inferior vena cava is normal in size with greater than 50%  respiratory variability, suggesting right atrial pressure of 3 mmHg.  __________   Zio patch 08/2022: HR 28 - 154 bpm, average 63 bpm. 1 nonsustained VT lasting 7 beats. 7% AF/AFL burden, longest episode lasting 9hr19min. 6226 episodes of high degree/3rd degree AV block. Rare supraventricular ectopy. Rare ventricular ectopy.   Patient is s/p PPM with Dr Rodolfo Clan. __________   2D echo 08/06/2022: 1. Left ventricular ejection fraction, by estimation, is 40 to 45%. The  left ventricle has mildly decreased function. The left ventricle  demonstrates global hypokinesis. Left ventricular  diastolic parameters are  indeterminate.   2. Right ventricular systolic function is normal. The right ventricular  size is normal. There is normal pulmonary artery systolic pressure.   3. The mitral valve is normal in structure. Mild mitral valve  regurgitation. No evidence of mitral stenosis.   4. The aortic valve is normal in structure. Aortic valve regurgitation is  trivial. Aortic valve sclerosis is present, with no evidence of aortic  valve stenosis.  __________   LHC 08/05/2022:   Prox RCA lesion is 40% stenosed.   Ost LM to Mid LM lesion is 95% stenosed.   A drug-eluting stent  was successfully placed using a STENT ONYX FRONTIER 3.5X12.   Post intervention, there is a 0% residual stenosis.   There is severe left ventricular systolic dysfunction.   LV end diastolic pressure is mildly elevated.   The left ventricular ejection fraction is less than 25% by visual estimate.   1.  Severe ostial left main stenosis with hazy appearance highly suggestive of acute plaque rupture.  No other obstructive disease. 2.  Severely reduced LV systolic function with an EF of 20% with akinesia of the mid to distal anterior, apical and infero-Apical myocardium. 3.  Successful angioplasty and drug-eluting stent placement to the ostial left main coronary artery.   Recommendations: Dual antiplatelet therapy for at least 12 months. No beta-blockers for now due to bradycardia.  Monitor heart rate and blood pressure closely as the patient is at risk for decompensation given degree of myocardium at risk. Delays and door to device time related to language barrier and the difficulty of obtaining an interpreter.   EKG:  EKG is ordered today.  The EKG ordered today demonstrates A-sensed V-paced, 62 bpm  Recent Labs: 10/19/2022: Hemoglobin 13.1; Platelets 183 11/16/2022: ALT 39 02/15/2023: Magnesium 2.0 02/22/2023: BUN 24; Creatinine, Ser 0.86; Potassium 4.5; Sodium 138  Recent Lipid Panel    Component Value  Date/Time   CHOL 107 11/16/2022 1220   TRIG 87 11/16/2022 1220   HDL 51 11/16/2022 1220   CHOLHDL 2.1 11/16/2022 1220   VLDL 17 11/16/2022 1220   LDLCALC 39 11/16/2022 1220   LDLDIRECT 41 11/16/2022 1220    PHYSICAL EXAM:    VS:  BP 100/60 (BP Location: Left Arm, Patient Position: Sitting)   Pulse 62   Ht 5\' 5"  (1.651 m)   Wt 106 lb 6.4 oz (48.3 kg)   SpO2 99%   BMI 17.71 kg/m   BMI: Body mass index is 17.71 kg/m.  Physical Exam Vitals reviewed.  Constitutional:      Appearance: He is well-developed.  HENT:     Head: Normocephalic and atraumatic.  Eyes:     General:        Right eye: No discharge.        Left eye: No discharge.  Cardiovascular:     Rate and Rhythm: Normal rate and regular rhythm.     Heart sounds: Normal heart sounds, S1 normal and S2 normal. Heart sounds not distant. No midsystolic click and no opening snap. No murmur heard.    No friction rub.  Pulmonary:     Effort: Pulmonary effort is normal. No respiratory distress.     Breath sounds: Normal breath sounds. No decreased breath sounds, wheezing, rhonchi or rales.  Chest:     Chest wall: No tenderness.  Musculoskeletal:     Cervical back: Normal range of motion.     Right lower leg: No edema.     Left lower leg: No edema.  Skin:    General: Skin is warm and dry.     Nails: There is no clubbing.  Neurological:     Mental Status: He is alert and oriented to person, place, and time.  Psychiatric:        Speech: Speech normal.        Behavior: Behavior normal.        Thought Content: Thought content normal.        Judgment: Judgment normal.     Wt Readings from Last 3 Encounters:  09/20/23 106 lb 6.4 oz (48.3 kg)  06/14/23 108 lb  3.2 oz (49.1 kg)  03/29/23 105 lb 2 oz (47.7 kg)     ASSESSMENT & PLAN:   CAD involving the native coronary arteries without angina: He continues to do well and is without symptoms of angina or cardiac decompensation.  Continue aggressive risk modification and  secondary prevention including clopidogrel  with apixaban  given underlying A-fib as long as tolerated given ostial left main stent.  In this setting he will continue Protonix .  No falls or symptoms concerning for bleeding.  Recent labs stable.  He has been out of atorvastatin  for approximately 6 weeks, this will be refilled today and he will resume.  He will otherwise continue metoprolol  and losartan .  No indication for further ischemic testing at this time.  HFrEF secondary to ICM: He is euvolemic and well compensated with NYHA class I symptoms.  Not requiring a standing loop diuretic.  He remains on metoprolol  succinate 25 mg daily, losartan  20 mg, and spironolactone  12.5 mg.  Most recent echo showed improvement in LV systolic function up to 45%.  Relative hypotension precludes further escalation of GDMT at this time, including transition of ARB to ARNI.  Recent labs stable.  Anticipate limited echo at next visit to trend to cardiomyopathy.  PAF: Paced rhythm.  Remains on Toprol -XL 25 mg.  Most recent device interrogation showed 1 A-fib events lasting 7 hours and 28 minutes with controlled ventricular rates with an overall burden of 0.3%.  CHA2DS2-VASc at least 3 (CHF, HTN, vascular disease).  Continue apixaban  5 mg twice daily indefinitely, as long as tolerated.  No falls or symptoms concerning for bleeding.  Recent labs stable.  Ongoing management per EP.  Complete heart block: Status post CRT-P in 08/2022.  Device function appropriately.  Follow up with EP as directed.  HTN: Blood pressure is well-controlled in the office today.  Continue pharmacotherapy as outlined above.  HLD: LDL 43 in 06/2023 with target LDL < 55.  Resume atorvastatin  80 mg daily.  Language barrier: [place] telephone medical interpreter utilized for today's visit.     Disposition: F/u with Dr. Alvenia Aus or an APP in 6 months, and EP as directed.    Medication Adjustments/Labs and Tests Ordered: Current medicines are  reviewed at length with the patient today.  Concerns regarding medicines are outlined above. Medication changes, Labs and Tests ordered today are summarized above and listed in the Patient Instructions accessible in Encounters.   Signed, Varney Gentleman, PA-C 09/20/2023 9:59 AM     [facility] [place] [address] [place], [place] [address] [phone]

## 2023-09-20 ENCOUNTER — Ambulatory Visit: Payer: BLUE CROSS/BLUE SHIELD | Attending: Physician Assistant | Admitting: Physician Assistant

## 2023-09-20 ENCOUNTER — Encounter: Payer: Self-pay | Admitting: Physician Assistant

## 2023-09-20 VITALS — BP 100/60 | HR 62 | Ht 65.0 in | Wt 106.4 lb

## 2023-09-20 DIAGNOSIS — I2510 Atherosclerotic heart disease of native coronary artery without angina pectoris: Secondary | ICD-10-CM | POA: Diagnosis not present

## 2023-09-20 DIAGNOSIS — Z950 Presence of cardiac pacemaker: Secondary | ICD-10-CM

## 2023-09-20 DIAGNOSIS — I5020 Unspecified systolic (congestive) heart failure: Secondary | ICD-10-CM

## 2023-09-20 DIAGNOSIS — I480 Paroxysmal atrial fibrillation: Secondary | ICD-10-CM | POA: Diagnosis not present

## 2023-09-20 DIAGNOSIS — I10 Essential (primary) hypertension: Secondary | ICD-10-CM

## 2023-09-20 DIAGNOSIS — I442 Atrioventricular block, complete: Secondary | ICD-10-CM

## 2023-09-20 DIAGNOSIS — Z603 Acculturation difficulty: Secondary | ICD-10-CM

## 2023-09-20 DIAGNOSIS — I255 Ischemic cardiomyopathy: Secondary | ICD-10-CM

## 2023-09-20 DIAGNOSIS — Z758 Other problems related to medical facilities and other health care: Secondary | ICD-10-CM

## 2023-09-20 DIAGNOSIS — E785 Hyperlipidemia, unspecified: Secondary | ICD-10-CM

## 2023-09-20 MED ORDER — METOPROLOL SUCCINATE ER 25 MG PO TB24: 25.0000 mg | ORAL_TABLET | Freq: Every day | ORAL | 11 refills | Status: AC

## 2023-09-20 MED ORDER — PANTOPRAZOLE SODIUM 40 MG PO TBEC: 40.0000 mg | DELAYED_RELEASE_TABLET | Freq: Every day | ORAL | 3 refills | Status: AC

## 2023-09-20 MED ORDER — CLOPIDOGREL BISULFATE 75 MG PO TABS: 75.0000 mg | ORAL_TABLET | Freq: Every day | ORAL | 3 refills | Status: AC

## 2023-09-20 MED ORDER — ATORVASTATIN CALCIUM 80 MG PO TABS: 80.0000 mg | ORAL_TABLET | Freq: Every day | ORAL | 3 refills | Status: AC

## 2023-09-20 MED ORDER — SPIRONOLACTONE 25 MG PO TABS: 12.5000 mg | ORAL_TABLET | Freq: Every day | ORAL | 3 refills | Status: AC

## 2023-09-20 MED ORDER — APIXABAN 5 MG PO TABS: 5.0000 mg | ORAL_TABLET | Freq: Two times a day (BID) | ORAL | 11 refills | Status: DC

## 2023-09-20 MED ORDER — LOSARTAN POTASSIUM 25 MG PO TABS: 25.0000 mg | ORAL_TABLET | Freq: Every day | ORAL | 3 refills | Status: AC

## 2023-09-20 NOTE — Patient Instructions (Signed)
 Medication Instructions:  Your Physician recommend you continue on your current medication as directed.     ??????????????????????????????????????????????????????????????????. thanmo khongthan naenoahai than subto sai ya naipachuban khongthan tam Emery Hans   *If you need a refill on your cardiac medications before your next appointment, please call your pharmacy*  ????????????????????????????????????????????????????????????????????????????????????, ?????????????????????????? * tha hak va than tong kan tum kho mun sai ya pin pua hua chai khong than kon kan nad mai khang to [employer] than  ka lu na oth ha han khai ya [employer] Work: None ordered at this time  If you have labs (blood work) drawn today and your tests are completely normal, you will receive your results only by: [employer] (if you have MyChart) OR A paper copy in the mail If you have any lab test that is abnormal or we need to change your treatment, we will call you to review the results.  Follow-Up: At [facility], you and your health needs are our priority.  As part of our continuing mission to provide you with exceptional heart care, our providers are all part of one team.  This team includes your primary Cardiologist (physician) and Advanced Practice Providers or APPs (Physician Assistants and Nurse Practitioners) who all work together to provide you with the care you need, when you need it.  Your next appointment:   6 month(s)  Provider:   Varney Gentleman, PA-C

## 2023-09-26 NOTE — Progress Notes (Unsigned)
 Advanced Heart Failure Clinic Note   PCP: Imogene Mana, FNP  Primary Cardiologist: Antionette Kirks, MD / Varney Gentleman, [place] (last seen 04/25)  Chief Complaint: fatigue  HPI:  Patient wanted to use his niece Craig Reid as interpreter for today's visit.   Craig Reid is a 63 y/o male with a history of CAD/ STEMI thought to be due to plaque rupture and heart failure, biventricular pacemaker placed on 08/17/22 due to CHB, PAF, hyperlipidemia, ICM and HTN.   Admitted 08/05/22 following a syncopal episode at work with a hospital CPR 1 minute gaining consciousness. Upon EMS arrival EKG showed anterior ST elevation MI. In the ED, evidence of bradycardia with intermittent high-grade AV block. Emergent LHC showed severe ostial left main stenosis with hazy appearance highly suggestive of acute plaque rupture. Otherwise, there was no other obstructive disease. There was severely reduced LV systolic function with an EF of 20% with akinesia of the mid to distal anterior, apical, and inferoapical myocardium. He underwent successful PCI/DES to the ostial left main. During the admission, he was evaluated by EP given bradycardia and high-grade AV block with 2-1 conduction with recommendation for for continued monitoring following PCI.   LHC 08/05/22:    Prox RCA lesion is 40% stenosed.   Ost LM to Mid LM lesion is 95% stenosed.   A drug-eluting stent was successfully placed using a STENT ONYX FRONTIER 3.5X12.   Post intervention, there is a 0% residual stenosis.   There is severe left ventricular systolic dysfunction.   LV end diastolic pressure is mildly elevated.   The left ventricular ejection fraction is less than 25% by visual estimate.  1.  Severe ostial left main stenosis with hazy appearance highly suggestive of acute plaque rupture.  No other obstructive disease. 2.  Severely reduced LV systolic function with an EF of 20% with akinesia of the mid to distal anterior, apical and infero-Apical myocardium. 3.   Successful angioplasty and drug-eluting stent placement to the ostial left main coronary artery.  Subsequent outpatient cardiac monitor showed an average rate of 63 bpm with a range of 28 to 154 bpm, 1 run of NSVT lasting 7 beats, 7% A-fib/flutter burden with the longest episode lasting 9 hours and 15 minutes, 6226 episodes of high degree/third-degree AV block.  Biv pacemaker implanted 08/17/22.  Echo 11/23/22: EF 40-45% along with Grade I DD, mild Craig  He presents today for a HF f/u visit with a chief complaint of minimal fatigue. Has rare palpitations when overexerting himself. Denies shortness of breath, chest pain, cough, palpitations, abdominal distention, pedal edema or difficulty sleeping. Currently has all of his medications. Does note that his feet feel cold a lot of time and has been told it could be his eliquis .   ROS: All systems negative except as listed in HPI, PMH and Problem List.  SH:  Social History   Socioeconomic History   Marital status: Married    Spouse name: Not on file   Number of children: Not on file   Years of education: Not on file   Highest education level: Not on file  Occupational History   Not on file  Tobacco Use   Smoking status: Never   Smokeless tobacco: Never  Vaping Use   Vaping status: Never Used  Substance and Sexual Activity   Alcohol use: Never   Drug use: Never   Sexual activity: Not on file  Other Topics Concern   Not on file  Social History Narrative   Not on  file   Social Drivers of Health   Financial Resource Strain: Low Risk  (06/04/2023)   Received from [facility]   Overall Financial Resource Strain (CARDIA)    Difficulty of Paying Living Expenses: Not hard at all  Food Insecurity: No Food Insecurity (06/04/2023)   Received from [facility]   Hunger Vital Sign    Worried About Running Out of Food in the Last Year: Never true    Ran Out of Food in the Last Year: Never true  Transportation  Needs: No Transportation Needs (06/04/2023)   Received from [facility] - Transportation    In the past 12 months, has lack of transportation kept you from medical appointments or from getting medications?: No    Lack of Transportation (Non-Medical): No  Physical Activity: Insufficiently Active (12/11/2019)   Received from [facility]   Exercise Vital Sign    Days of Exercise per Week: 1 day    Minutes of Exercise per Session: 30 min  Stress: No Stress Concern Present (12/11/2019)   Received from [facility]ational [facility] - Occupational Stress Questionnaire    Feeling of Stress : Not at all  Social Connections: Unknown (08/06/2022)   Social Connection and Isolation Panel [NHANES]    Frequency of Communication with Friends and Family: More than three times a week    Frequency of Social Gatherings with Friends and Family: More than three times a week    Attends Religious Services: 1 to 4 times per year    Active Member of [employer] or Organizations: No    Attends Banker Meetings: Never    Marital Status: Patient declined  Catering manager Violence: Not At Risk (08/06/2022)   Humiliation, Afraid, Rape, and Kick questionnaire    Fear of Current or Ex-Partner: No    Emotionally Abused: No    Physically Abused: No    Sexually Abused: No    FH: No family history on file.  Past Medical History:  Diagnosis Date   Complete heart block (HCC)    a. BiV PPM 3/24   Coronary artery disease    a. ant STEMI 3/24 ost LM 95% s/p PCI/DES to LM, pRCA 40%   Essential hypertension    Hyperlipidemia LDL goal <50    Ischemic cardiomyopathy    PAF (paroxysmal atrial fibrillation) (HCC)     Current Outpatient Medications  Medication Sig Dispense Refill   apixaban  (ELIQUIS ) 5 MG TABS tablet Take 1 tablet (5 mg total) by mouth 2 (two) times daily. 60 tablet 11   atorvastatin  (LIPITOR ) 80 MG tablet Take 1  tablet (80 mg total) by mouth daily. 90 tablet 3   clopidogrel  (PLAVIX ) 75 MG tablet Take 1 tablet (75 mg total) by mouth daily. 90 tablet 3   empagliflozin  (JARDIANCE ) 10 MG TABS tablet TAKE 1 TABLET BY MOUTH ONCE DAILY BEFORE BREAKFAST 30 tablet 5   losartan  (COZAAR ) 25 MG tablet Take 1 tablet (25 mg total) by mouth at bedtime. 90 tablet 3   melatonin 5 MG TABS Take 5-10 mg by mouth at bedtime as needed.     Melatonin-Pyridoxine 5-1 MG TABS Take by mouth.     metoprolol  succinate (TOPROL -XL) 25 MG 24 hr tablet Take 1 tablet (25 mg total) by mouth daily. 30 tablet 11   Multiple Vitamin (MULTIVITAMIN WITH MINERALS) TABS tablet Take 1 tablet by mouth daily.  pantoprazole  (PROTONIX ) 40 MG tablet Take 1 tablet (40 mg total) by mouth daily. 90 tablet 3   spironolactone  (ALDACTONE ) 25 MG tablet Take 0.5 tablets (12.5 mg total) by mouth daily. 45 tablet 3   No current facility-administered medications for this visit.   Vitals:   09/27/23 0926  BP: 125/83  Pulse: 73  SpO2: 99%  Weight: 106 lb (48.1 kg)   Wt Readings from Last 3 Encounters:  09/27/23 106 lb (48.1 kg)  09/20/23 106 lb 6.4 oz (48.3 kg)  06/14/23 108 lb 3.2 oz (49.1 kg)    PHYSICAL EXAM:  General: Well appearing. No resp difficulty HEENT: normal Neck: supple, no JVD Cor: Regular rhythm, rate. No rubs, gallops. II/VI murmur LSB. Lungs: clear Abdomen: soft, nontender, nondistended. Extremities: no cyanosis, clubbing, rash, edema Neuro: alert & oriented X 3. Moves all 4 extremities w/o difficulty. Affect pleasant   ECG: not done   ASSESSMENT & PLAN:  1: Ischemic heart failure with reduced ejection fraction- - likely CAD with cath results showing stenosis - NYHA class II - euvolemic - weighing daily; reminded to call for an overnight weight gain of > 2 pounds or a weekly weight gain of > 5 pounds - weight stable from last visit here 6 months ago - BP 125/83 - echo 08/06/22: EF 40-45% - echo 11/23/22: EF 40-45%  along with Grade I DD, mild Craig - CRT-P placed 08/17/22 - adds "very little" salt to his food - continue metoprolol  succinate 25mg  daily  - continue losartan  25mg  daily at bedtime (has been hypotensive in the past so doubt can transition to entresto) - continue spironolactone  12.5mg  daily - continue jardiance  10mg  daily - BNP 08/26/22 was 191.1  2: CAD- - saw cardiology (D[place]) 04/25 - continue plavix  75mg  daily - continue atorvastatin  80mg  QPM - LDL 11/16/22 was 39 - STEMI thought to be due to plaque rupture - LHC 08/05/22:   Prox RCA lesion is 40% stenosed.   Ost LM to Mid LM lesion is 95% stenosed.   A drug-eluting stent was successfully placed using a STENT ONYX FRONTIER 3.5X12.   Post intervention, there is a 0% residual stenosis.   There is severe left ventricular systolic dysfunction.   LV end diastolic pressure is mildly elevated.   The left ventricular ejection fraction is less than 25% by visual estimate.  Severe ostial left main stenosis with hazy appearance highly suggestive of acute plaque rupture.  No other obstructive disease. Severely reduced LV systolic function with an EF of 20% with akinesia of the mid to distal anterior, apical and infero-Apical myocardium. Successful angioplasty and drug-eluting stent placement to the ostial left main coronary artery.  3: CHB- - saw EP Rodolfo Clan) 6/24; returns ~ 9 months - BIV pacemaker CRT-P placed 08/17/22 - BMP 02/22/23 showed sodium 138, potassium 4.5, creatinine 0.86 & GFR 99 - Mg 02/15/23 was 2.0  4: Paroxysmal Atrial fibrillation- - previous zio showed atrial fibrillation - continue apixaban  5mg  BID  5: Language barrier- - Per his request, niece Craig Reid, was used for interpreter services   Due to HF stability, will not make a return appointment at this time. Advised to follow closely with cardiology but that he could return at anytime for another appointment and he was comfortable with this plan.   Charlette Console, FNP 09/26/23

## 2023-09-27 ENCOUNTER — Encounter: Payer: Self-pay | Admitting: Family

## 2023-09-27 ENCOUNTER — Ambulatory Visit: Payer: 59 | Attending: Family | Admitting: Family

## 2023-09-27 VITALS — BP 125/83 | HR 73 | Wt 106.0 lb

## 2023-09-27 DIAGNOSIS — Z950 Presence of cardiac pacemaker: Secondary | ICD-10-CM | POA: Insufficient documentation

## 2023-09-27 DIAGNOSIS — I480 Paroxysmal atrial fibrillation: Secondary | ICD-10-CM | POA: Insufficient documentation

## 2023-09-27 DIAGNOSIS — Z7902 Long term (current) use of antithrombotics/antiplatelets: Secondary | ICD-10-CM | POA: Diagnosis not present

## 2023-09-27 DIAGNOSIS — I255 Ischemic cardiomyopathy: Secondary | ICD-10-CM | POA: Insufficient documentation

## 2023-09-27 DIAGNOSIS — Z7984 Long term (current) use of oral hypoglycemic drugs: Secondary | ICD-10-CM | POA: Insufficient documentation

## 2023-09-27 DIAGNOSIS — Z7901 Long term (current) use of anticoagulants: Secondary | ICD-10-CM | POA: Insufficient documentation

## 2023-09-27 DIAGNOSIS — Z758 Other problems related to medical facilities and other health care: Secondary | ICD-10-CM

## 2023-09-27 DIAGNOSIS — I442 Atrioventricular block, complete: Secondary | ICD-10-CM | POA: Diagnosis not present

## 2023-09-27 DIAGNOSIS — I252 Old myocardial infarction: Secondary | ICD-10-CM | POA: Diagnosis not present

## 2023-09-27 DIAGNOSIS — I5020 Unspecified systolic (congestive) heart failure: Secondary | ICD-10-CM

## 2023-09-27 DIAGNOSIS — Z603 Acculturation difficulty: Secondary | ICD-10-CM

## 2023-09-27 DIAGNOSIS — I110 Hypertensive heart disease with heart failure: Secondary | ICD-10-CM | POA: Insufficient documentation

## 2023-09-27 DIAGNOSIS — I5022 Chronic systolic (congestive) heart failure: Secondary | ICD-10-CM | POA: Insufficient documentation

## 2023-09-27 DIAGNOSIS — I2510 Atherosclerotic heart disease of native coronary artery without angina pectoris: Secondary | ICD-10-CM | POA: Insufficient documentation

## 2023-09-27 DIAGNOSIS — R5383 Other fatigue: Secondary | ICD-10-CM | POA: Diagnosis present

## 2023-09-27 NOTE — Patient Instructions (Signed)
 It was good to see you today.    Follow with cardiology and call us  if you ever need us  for anything.

## 2023-10-08 NOTE — Addendum Note (Signed)
 Addended by: Edra Govern D on: 10/08/2023 12:47 PM   Modules accepted: Orders

## 2023-10-08 NOTE — Progress Notes (Signed)
 Remote pacemaker transmission.

## 2023-10-29 ENCOUNTER — Other Ambulatory Visit: Payer: Self-pay | Admitting: Physician Assistant

## 2023-10-31 NOTE — Telephone Encounter (Signed)
**Note De-identified  [place] Obfuscation** Please advise 

## 2023-11-18 ENCOUNTER — Ambulatory Visit (INDEPENDENT_AMBULATORY_CARE_PROVIDER_SITE_OTHER): Payer: 59

## 2023-11-18 DIAGNOSIS — I441 Atrioventricular block, second degree: Secondary | ICD-10-CM

## 2023-11-18 LAB — CUP PACEART REMOTE DEVICE CHECK
Battery Remaining Longevity: 97 mo
Battery Voltage: 2.98 V
Brady Statistic AP VP Percent: 18.57 %
Brady Statistic AP VS Percent: 0.01 %
Brady Statistic AS VP Percent: 79.91 %
Brady Statistic AS VS Percent: 1.51 %
Brady Statistic RA Percent Paced: 19.23 %
Brady Statistic RV Percent Paced: 98.48 %
Date Time Interrogation Session: 20250615221521
Implantable Lead Connection Status: 753985
Implantable Lead Connection Status: 753985
Implantable Lead Connection Status: 753985
Implantable Lead Implant Date: 20240315
Implantable Lead Implant Date: 20240315
Implantable Lead Implant Date: 20240315
Implantable Lead Location: 753859
Implantable Lead Location: 753860
Implantable Lead Location: 753860
Implantable Lead Model: 3830
Implantable Lead Model: 5076
Implantable Lead Model: 5076
Implantable Pulse Generator Implant Date: 20240315
Lead Channel Impedance Value: 228 Ohm
Lead Channel Impedance Value: 304 Ohm
Lead Channel Impedance Value: 342 Ohm
Lead Channel Impedance Value: 361 Ohm
Lead Channel Impedance Value: 361 Ohm
Lead Channel Impedance Value: 399 Ohm
Lead Channel Impedance Value: 399 Ohm
Lead Channel Impedance Value: 418 Ohm
Lead Channel Impedance Value: 456 Ohm
Lead Channel Pacing Threshold Amplitude: 0.5 V
Lead Channel Pacing Threshold Amplitude: 0.625 V
Lead Channel Pacing Threshold Amplitude: 1.625 V
Lead Channel Pacing Threshold Pulse Width: 0.4 ms
Lead Channel Pacing Threshold Pulse Width: 0.4 ms
Lead Channel Pacing Threshold Pulse Width: 0.4 ms
Lead Channel Sensing Intrinsic Amplitude: 0.625 mV
Lead Channel Sensing Intrinsic Amplitude: 0.625 mV
Lead Channel Sensing Intrinsic Amplitude: 6.875 mV
Lead Channel Sensing Intrinsic Amplitude: 6.875 mV
Lead Channel Setting Pacing Amplitude: 1.5 V
Lead Channel Setting Pacing Amplitude: 2 V
Lead Channel Setting Pacing Amplitude: 2.25 V
Lead Channel Setting Pacing Pulse Width: 0.4 ms
Lead Channel Setting Pacing Pulse Width: 0.4 ms
Lead Channel Setting Sensing Sensitivity: 1.2 mV
Zone Setting Status: 755011
Zone Setting Status: 755011

## 2023-11-19 ENCOUNTER — Ambulatory Visit: Payer: Self-pay | Admitting: Cardiology

## 2023-12-26 NOTE — Progress Notes (Signed)
 Remote pacemaker transmission.

## 2024-01-08 ENCOUNTER — Other Ambulatory Visit: Payer: Self-pay | Admitting: Family

## 2024-01-08 ENCOUNTER — Other Ambulatory Visit
Admission: RE | Admit: 2024-01-08 | Discharge: 2024-01-08 | Disposition: A | Discharge status: Home / Self Care | Source: Ambulatory Visit | Attending: Family | Admitting: Family

## 2024-01-08 ENCOUNTER — Ambulatory Visit: Payer: Self-pay | Admitting: Family

## 2024-01-08 ENCOUNTER — Other Ambulatory Visit: Payer: Self-pay

## 2024-01-08 DIAGNOSIS — I5020 Unspecified systolic (congestive) heart failure: Secondary | ICD-10-CM

## 2024-01-08 DIAGNOSIS — I480 Paroxysmal atrial fibrillation: Secondary | ICD-10-CM | POA: Diagnosis present

## 2024-01-08 LAB — MAGNESIUM: Magnesium: 2.2 mg/dL (ref 1.7–2.4)

## 2024-01-08 LAB — BASIC METABOLIC PANEL WITH GFR
Anion gap: 7 (ref 5–15)
BUN: 24 mg/dL — ABNORMAL HIGH (ref 8–23)
CO2: 27 mmol/L (ref 22–32)
Calcium: 9 mg/dL (ref 8.9–10.3)
Chloride: 104 mmol/L (ref 98–111)
Creatinine, Ser: 0.92 mg/dL (ref 0.61–1.24)
GFR, Estimated: >60 mL/min (ref >60)
Glucose, Bld: 82 mg/dL (ref 70–99)
Potassium: 3.3 mmol/L — ABNORMAL LOW (ref 3.5–5.1)
Sodium: 138 mmol/L (ref 135–145)

## 2024-01-08 MED ORDER — POTASSIUM CHLORIDE CRYS ER 20 MEQ PO TBCR: 10.0000 meq | EXTENDED_RELEASE_TABLET | Freq: Every day | ORAL | Status: AC

## 2024-01-08 NOTE — Addendum Note (Signed)
 Addended by: Byrd Rushlow M on: 01/08/2024 03:45 PM   Modules accepted: Orders

## 2024-01-26 ENCOUNTER — Other Ambulatory Visit: Payer: Self-pay | Admitting: Family

## 2024-01-27 ENCOUNTER — Other Ambulatory Visit: Payer: Self-pay | Admitting: Cardiovascular Disease

## 2024-01-27 DIAGNOSIS — I480 Paroxysmal atrial fibrillation: Secondary | ICD-10-CM

## 2024-01-27 NOTE — Telephone Encounter (Signed)
 Is pt supposed to be on Eliquis  and Clopidogrel . **See pharmacy correspondence scanned in media

## 2024-02-04 MED ORDER — APIXABAN 5 MG PO TABS: 5.0000 mg | ORAL_TABLET | Freq: Two times a day (BID) | ORAL | 11 refills | Status: AC

## 2024-02-05 ENCOUNTER — Other Ambulatory Visit: Payer: Self-pay | Admitting: Physician Assistant

## 2024-02-05 DIAGNOSIS — I480 Paroxysmal atrial fibrillation: Secondary | ICD-10-CM

## 2024-02-06 NOTE — Telephone Encounter (Signed)
 Prescription refill request for Eliquis  received. Indication: afib  Last office visit: 4/25/20225 Scr: 0.92, 01/08/2024 Age: 63 yo  Weight: 48.1 kg   Per note from 01/28/2024 pt need to be on plavix  and eliquis .

## 2024-02-06 NOTE — Telephone Encounter (Signed)
 Called and spoke to [place] who is a Teacher, early years/pre from [place].   Read to her the response from Bernardino Bring:   Bring Bernardino HERO, PA-C to Desiderio Russell SAILOR, RN  Hannah Marcus KIDD, RN     01/28/24  8:15 AM Yes, the patient should be on clopidogrel  and apixaban  given ostial left main stent and A-fib.

## 2024-02-17 ENCOUNTER — Ambulatory Visit (INDEPENDENT_AMBULATORY_CARE_PROVIDER_SITE_OTHER): Payer: 59

## 2024-02-17 DIAGNOSIS — I441 Atrioventricular block, second degree: Secondary | ICD-10-CM

## 2024-02-18 LAB — CUP PACEART REMOTE DEVICE CHECK
Battery Remaining Longevity: 93 mo
Battery Voltage: 2.98 V
Brady Statistic AP VP Percent: 22.66 %
Brady Statistic AP VS Percent: 0.01 %
Brady Statistic AS VP Percent: 75.92 %
Brady Statistic AS VS Percent: 1.41 %
Brady Statistic RA Percent Paced: 23.04 %
Brady Statistic RV Percent Paced: 98.57 %
Date Time Interrogation Session: 20250914212445
Implantable Lead Connection Status: 753985
Implantable Lead Connection Status: 753985
Implantable Lead Connection Status: 753985
Implantable Lead Implant Date: 20240315
Implantable Lead Implant Date: 20240315
Implantable Lead Implant Date: 20240315
Implantable Lead Location: 753859
Implantable Lead Location: 753860
Implantable Lead Location: 753860
Implantable Lead Model: 3830
Implantable Lead Model: 5076
Implantable Lead Model: 5076
Implantable Pulse Generator Implant Date: 20240315
Lead Channel Impedance Value: 228 Ohm
Lead Channel Impedance Value: 304 Ohm
Lead Channel Impedance Value: 323 Ohm
Lead Channel Impedance Value: 323 Ohm
Lead Channel Impedance Value: 342 Ohm
Lead Channel Impedance Value: 380 Ohm
Lead Channel Impedance Value: 399 Ohm
Lead Channel Impedance Value: 399 Ohm
Lead Channel Impedance Value: 437 Ohm
Lead Channel Pacing Threshold Amplitude: 0.375 V
Lead Channel Pacing Threshold Amplitude: 0.625 V
Lead Channel Pacing Threshold Amplitude: 1.625 V
Lead Channel Pacing Threshold Pulse Width: 0.4 ms
Lead Channel Pacing Threshold Pulse Width: 0.4 ms
Lead Channel Pacing Threshold Pulse Width: 0.4 ms
Lead Channel Sensing Intrinsic Amplitude: 0.375 mV
Lead Channel Sensing Intrinsic Amplitude: 0.375 mV
Lead Channel Sensing Intrinsic Amplitude: 10.625 mV
Lead Channel Sensing Intrinsic Amplitude: 10.625 mV
Lead Channel Setting Pacing Amplitude: 1.5 V
Lead Channel Setting Pacing Amplitude: 2 V
Lead Channel Setting Pacing Amplitude: 2.25 V
Lead Channel Setting Pacing Pulse Width: 0.4 ms
Lead Channel Setting Pacing Pulse Width: 0.4 ms
Lead Channel Setting Sensing Sensitivity: 1.2 mV
Zone Setting Status: 755011
Zone Setting Status: 755011

## 2024-02-20 ENCOUNTER — Ambulatory Visit: Payer: Self-pay | Admitting: Cardiology

## 2024-02-24 NOTE — Progress Notes (Signed)
 Remote PPM Transmission

## 2024-03-23 ENCOUNTER — Telehealth: Payer: Self-pay

## 2024-03-23 DIAGNOSIS — I441 Atrioventricular block, second degree: Secondary | ICD-10-CM

## 2024-03-23 DIAGNOSIS — I480 Paroxysmal atrial fibrillation: Secondary | ICD-10-CM

## 2024-03-23 DIAGNOSIS — Z950 Presence of cardiac pacemaker: Secondary | ICD-10-CM

## 2024-03-23 NOTE — Telephone Encounter (Signed)
-----   Message from Nurse Delon RAMAN sent at 03/23/2024  9:07 AM EDT ----- Regarding: FW: Please schedule next available.  Overdue for follow up.  prior SK Pt.  MDT BIV PPM Just wanted to follow up on this Pt, since we are still getting is remote transmissions.  Cindie Pt that needs a referral. Thank you Randall ----- Message ----- From: Agapito Audrene SAILOR Sent: 03/02/2024   9:18 AM EDT To: Delon DELENA Sharps, RN; Cv Div [facility] Scheduling Subject: RE: Please schedule next available.  Overdue#  Spoke with [place] we are out of network for pt insurance they would like to be referred to [facility] or other cardiologist that's in network. ----- Message ----- From: Sharps Delon DELENA, RN Sent: 02/27/2024   1:04 PM EDT To: Audrene SAILOR Agapito; Cv Div [place] Scheduling Subject: RE: Please schedule next available.  Overdue#  Please contact niece Shawnee to schedule follow up. Thank you! Randall ----- Message ----- From: Agapito Audrene SAILOR Sent: 02/21/2024  10:52 AM EDT To: Delon DELENA Sharps, RN; Cv Div [facility] Scheduling Subject: RE: Please schedule next available.  Overdue#  Per language line left vm to schedule. ----- Message ----- From: Sharps Delon DELENA, RN Sent: 02/19/2024   7:00 AM EDT To: Lurena [place] Scheduling Subject: Please schedule next available.  Overdue for#

## 2024-03-23 NOTE — Telephone Encounter (Signed)
 Referral has been placed.

## 2024-04-29 ENCOUNTER — Ambulatory Visit: Attending: Cardiology | Admitting: Cardiology

## 2024-04-29 ENCOUNTER — Encounter: Payer: Self-pay | Admitting: Cardiology

## 2024-04-29 VITALS — BP 100/66 | HR 68 | Ht 65.0 in | Wt 102.8 lb

## 2024-04-29 DIAGNOSIS — I5022 Chronic systolic (congestive) heart failure: Secondary | ICD-10-CM

## 2024-04-29 DIAGNOSIS — D6869 Other thrombophilia: Secondary | ICD-10-CM | POA: Diagnosis not present

## 2024-04-29 DIAGNOSIS — Z950 Presence of cardiac pacemaker: Secondary | ICD-10-CM | POA: Diagnosis not present

## 2024-04-29 DIAGNOSIS — I442 Atrioventricular block, complete: Secondary | ICD-10-CM

## 2024-04-29 DIAGNOSIS — I480 Paroxysmal atrial fibrillation: Secondary | ICD-10-CM | POA: Diagnosis not present

## 2024-04-29 LAB — CUP PACEART INCLINIC DEVICE CHECK
Date Time Interrogation Session: 20251126155711
Implantable Lead Connection Status: 753985
Implantable Lead Connection Status: 753985
Implantable Lead Connection Status: 753985
Implantable Lead Implant Date: 20240315
Implantable Lead Implant Date: 20240315
Implantable Lead Implant Date: 20240315
Implantable Lead Location: 753859
Implantable Lead Location: 753860
Implantable Lead Location: 753860
Implantable Lead Model: 3830
Implantable Lead Model: 5076
Implantable Lead Model: 5076
Implantable Pulse Generator Implant Date: 20240315

## 2024-04-29 NOTE — Progress Notes (Signed)
 Electrophysiology Clinic Note    Date:  04/29/2024  Patient ID:  Craig Reid, DOB Feb 16, 1961, MRN [id number] PCP:  [name], FNP (Inactive)  Cardiologist:  Deatrice Cage, MD  Electrophysiologist:  OLE ONEIDA HOLTS, MD    Discussed the use of AI scribe software for clinical note transcription with the patient, who gave verbal consent to proceed.   Patient Profile    Chief Complaint: Device follow-up  History of Present Illness: Craig Reid is a 63 y.o. male with PMH notable for CHB s/p CRT-P, CAD s/p PCI, HFmrEF, parox afib, HTN, HLD ; seen today for OLE ONEIDA HOLTS, MD for routine electrophysiology followup.   He presented to [facility] 08/2022 after syncopal event > CPR > STEMI > PCI. During hospitalization, he developed high grade AVB and is s/p CRT-P. Device detected AFib, so was started on eliquis .   He was last seen by Dr. Fernande 11/2022. He has followed up with general cardiology and HF   On follow-up today, he feels well from a cardiac standpoint.  He has no concerns related to his pacemaker.  He denies chest pain, chest pressure, palpitations, no increased swelling. He continues to take Eliquis  twice daily without bleeding concerns.    Audio interpreter utilized throughout visit.    Arrhythmia/Device History Medtronic CRT-P, imp 08/2022; dx CHB    ROS:  Please see the history of present illness. All other systems are reviewed and otherwise negative.    Physical Exam    VS:  BP 100/66   Pulse 68   Ht 5' 5 (1.651 m)   Wt 102 lb 12.8 oz (46.6 kg)   SpO2 98%   BMI 17.11 kg/m  BMI: Body mass index is 17.11 kg/m.           Wt Readings from Last 3 Encounters:  04/29/24 102 lb 12.8 oz (46.6 kg)  09/27/23 106 lb (48.1 kg)  09/20/23 106 lb 6.4 oz (48.3 kg)     GEN- The patient is well appearing, thin, alert and oriented x 3 today.   Lungs- Clear to ausculation bilaterally, normal work of breathing.  Heart- Regular rate and rhythm,  no murmurs, rubs or gallops Extremities- No peripheral edema, warm, dry Skin-  device pocket well-healed, no tethering   Device interrogation done today and reviewed by myself:  Battery 7.3 years Lead thresholds, impedence, sensing stable  Dependent down to VVI 40 Very brief NSVT episodes Parox AFib episodes noted Optivol low No changes made   Studies Reviewed   Previous EP, cardiology notes.    EKG is ordered. Personal review of EKG from today shows:    EKG Interpretation Date/Time:  Wednesday April 29 2024 15:00:34 EST Ventricular Rate:  68 PR Interval:  272 QRS Duration:  132 QT Interval:  470 QTC Calculation: 499 R Axis:   -65  Text Interpretation: ATRIAL SENSING Ventricular-paced rhythm Confirmed by Tayra Dawe [phone]) on 04/29/2024 3:47:12 PM    TTE, 11/23/2022 1. Left ventricular ejection fraction, by estimation, is 40 to 45%. Left ventricular ejection fraction by 2D MOD biplane is 44.3 %. The left ventricle has mildly decreased function. The left ventricle demonstrates regional wall motion abnormalities (Hypokinesis of the basal to mid septal, anteroseptal, and basal to mid inferior regions ). Left ventricular diastolic parameters are consistent with Grade I diastolic dysfunction (impaired relaxation). The average left ventricular global longitudinal strain is -14.0 %.   2. Right ventricular systolic function is normal. The right ventricular size is normal. Tricuspid  regurgitation signal is inadequate for assessing PA pressure.   3. The mitral valve is normal in structure. Mild mitral valve regurgitation. No evidence of mitral stenosis.   4. The aortic valve is normal in structure. Aortic valve regurgitation is not visualized. No aortic stenosis is present.   5. The inferior vena cava is normal in size with greater than 50% respiratory variability, suggesting right atrial pressure of 3 mmHg.   TTE, 08/06/2022 1. Left ventricular ejection fraction, by estimation, is  40 to 45%. The left ventricle has mildly decreased function. The left ventricle demonstrates global hypokinesis. Left ventricular diastolic parameters are indeterminate.   2. Right ventricular systolic function is normal. The right ventricular size is normal. There is normal pulmonary artery systolic pressure.   3. The mitral valve is normal in structure. Mild mitral valve regurgitation. No evidence of mitral stenosis.   4. The aortic valve is normal in structure. Aortic valve regurgitation is trivial. Aortic valve sclerosis is present, with no evidence of aortic valve stenosis.    Assessment and Plan     #) CHB s/p CRT-D Device functioning well, see paceart for details Dependent down to VVI 40 VP high  #) parox aFib Overall low burden Patient asymptomatic of episodes Continue 25mg  toprol  daily  #) Hypercoag d/t parox afib CHA2DS2-VASc Score = at least 3 [CHF History: 1, HTN History: 1, Diabetes History: 0, Stroke History: 0, Vascular Disease History: 1, Age Score: 0, Gender Score: 0].  Therefore, the patient's annual risk of stroke is 3.2 %.    Stroke ppx - 5mg  eliquis  BID, appropriately dosed No bleeding concerns  #) HFmrEF Appears warm and euvolemic on exam Optivol low Continue 10mg  jardiance , 25mg  losartan , 25mg  toprol , 12.5mg  spiro        Current medicines are reviewed at length with the patient today.   The patient does not have concerns regarding his medicines.  The following changes were made today:  none  Labs/ tests ordered today include:  Orders Placed This Encounter  Procedures   EKG 12-Lead     Disposition: Follow up with Dr. Kennyth or EP APP in 12 months, continue remote monitoring every 3 months   Follow-up with general cardiology in 3 months   Signed, Siler Mavis, NP  04/29/24  3:47 PM  Electrophysiology [facility]

## 2024-04-29 NOTE — Patient Instructions (Signed)
 Medication Instructions:  Your physician recommends that you continue on your current medications as directed. Please refer to the Current Medication list given to you today.  *If you need a refill on your cardiac medications before your next appointment, please call your pharmacy*  Lab Work: No labs ordered today    Testing/Procedures: No test ordered today   Follow-Up: At [facility], you and your health needs are our priority.  As part of our continuing mission to provide you with exceptional heart care, our providers are all part of one team.  This team includes your primary Cardiologist (physician) and Advanced Practice Providers or APPs (Physician Assistants and Nurse Practitioners) who all work together to provide you with the care you need, when you need it.  Your next appointment:   1 year(s) follow up with EP. 3 month Cardiology follow up with Bernardino Bring, PA-C.  Provider:   Suzann Riddle, NP

## 2024-05-01 ENCOUNTER — Ambulatory Visit: Payer: Self-pay | Admitting: Cardiology

## 2024-05-12 ENCOUNTER — Ambulatory Visit: Admitting: Physician Assistant

## 2024-05-18 ENCOUNTER — Ambulatory Visit: Payer: 59

## 2024-05-18 DIAGNOSIS — I442 Atrioventricular block, complete: Secondary | ICD-10-CM

## 2024-05-20 LAB — CUP PACEART REMOTE DEVICE CHECK
Battery Remaining Longevity: 80 mo
Battery Voltage: 2.97 V
Brady Statistic AP VP Percent: 18.42 %
Brady Statistic AP VS Percent: 0.01 %
Brady Statistic AS VP Percent: 80.76 %
Brady Statistic AS VS Percent: 0.79 %
Brady Statistic RA Percent Paced: 18.17 %
Brady Statistic RV Percent Paced: 99.04 %
Date Time Interrogation Session: 20251215003000
Implantable Lead Connection Status: 753985
Implantable Lead Connection Status: 753985
Implantable Lead Connection Status: 753985
Implantable Lead Implant Date: 20240315
Implantable Lead Implant Date: 20240315
Implantable Lead Implant Date: 20240315
Implantable Lead Location: 753859
Implantable Lead Location: 753860
Implantable Lead Location: 753860
Implantable Lead Model: 3830
Implantable Lead Model: 5076
Implantable Lead Model: 5076
Implantable Pulse Generator Implant Date: 20240315
Lead Channel Impedance Value: 228 Ohm
Lead Channel Impedance Value: 285 Ohm
Lead Channel Impedance Value: 323 Ohm
Lead Channel Impedance Value: 323 Ohm
Lead Channel Impedance Value: 323 Ohm
Lead Channel Impedance Value: 361 Ohm
Lead Channel Impedance Value: 380 Ohm
Lead Channel Impedance Value: 380 Ohm
Lead Channel Impedance Value: 418 Ohm
Lead Channel Pacing Threshold Amplitude: 0.375 V
Lead Channel Pacing Threshold Amplitude: 0.625 V
Lead Channel Pacing Threshold Amplitude: 2.25 V
Lead Channel Pacing Threshold Pulse Width: 0.4 ms
Lead Channel Pacing Threshold Pulse Width: 0.4 ms
Lead Channel Pacing Threshold Pulse Width: 0.4 ms
Lead Channel Sensing Intrinsic Amplitude: 2.125 mV
Lead Channel Sensing Intrinsic Amplitude: 2.125 mV
Lead Channel Sensing Intrinsic Amplitude: 8.25 mV
Lead Channel Sensing Intrinsic Amplitude: 8.25 mV
Lead Channel Setting Pacing Amplitude: 1.5 V
Lead Channel Setting Pacing Amplitude: 2 V
Lead Channel Setting Pacing Amplitude: 2.75 V
Lead Channel Setting Pacing Pulse Width: 0.4 ms
Lead Channel Setting Pacing Pulse Width: 0.4 ms
Lead Channel Setting Sensing Sensitivity: 1.2 mV
Zone Setting Status: 755011
Zone Setting Status: 755011

## 2024-05-21 ENCOUNTER — Ambulatory Visit: Payer: Self-pay | Admitting: Cardiology

## 2024-05-22 NOTE — Progress Notes (Signed)
 Remote PPM Transmission

## 2024-08-05 ENCOUNTER — Ambulatory Visit: Admitting: Physician Assistant
